# Patient Record
Sex: Male | Born: 1935 | Race: White | Hispanic: No | Marital: Married | Smoking: Never smoker
Health system: Southern US, Community
[De-identification: ages and names within clinical notes are randomized; demographics above are authoritative.]

## PROBLEM LIST (undated history)

## (undated) DIAGNOSIS — R001 Bradycardia, unspecified: Secondary | ICD-10-CM

## (undated) DIAGNOSIS — I951 Orthostatic hypotension: Secondary | ICD-10-CM

## (undated) DIAGNOSIS — I340 Nonrheumatic mitral (valve) insufficiency: Secondary | ICD-10-CM

## (undated) DIAGNOSIS — E785 Hyperlipidemia, unspecified: Secondary | ICD-10-CM

## (undated) DIAGNOSIS — R55 Syncope and collapse: Secondary | ICD-10-CM

## (undated) DIAGNOSIS — L570 Actinic keratosis: Secondary | ICD-10-CM

## (undated) DIAGNOSIS — E039 Hypothyroidism, unspecified: Secondary | ICD-10-CM

## (undated) HISTORY — PX: SHOULDER ARTHROSCOPY: SHX128

## (undated) HISTORY — DX: Actinic keratosis: L57.0

## (undated) HISTORY — DX: Orthostatic hypotension: I95.1

## (undated) HISTORY — DX: Syncope and collapse: R55

## (undated) HISTORY — DX: Hypothyroidism, unspecified: E03.9

## (undated) HISTORY — DX: Nonrheumatic mitral (valve) insufficiency: I34.0

## (undated) HISTORY — DX: Bradycardia, unspecified: R00.1

## (undated) HISTORY — PX: APPENDECTOMY: SHX54

## (undated) HISTORY — DX: Hyperlipidemia, unspecified: E78.5

---

## 2008-03-24 DIAGNOSIS — E7800 Pure hypercholesterolemia, unspecified: Secondary | ICD-10-CM | POA: Insufficient documentation

## 2009-09-29 DIAGNOSIS — E039 Hypothyroidism, unspecified: Secondary | ICD-10-CM | POA: Insufficient documentation

## 2016-06-06 DIAGNOSIS — H02831 Dermatochalasis of right upper eyelid: Secondary | ICD-10-CM | POA: Insufficient documentation

## 2016-06-06 DIAGNOSIS — H2513 Age-related nuclear cataract, bilateral: Secondary | ICD-10-CM | POA: Insufficient documentation

## 2016-06-06 DIAGNOSIS — H35373 Puckering of macula, bilateral: Secondary | ICD-10-CM | POA: Insufficient documentation

## 2016-06-06 DIAGNOSIS — H43813 Vitreous degeneration, bilateral: Secondary | ICD-10-CM | POA: Insufficient documentation

## 2016-06-06 DIAGNOSIS — H524 Presbyopia: Secondary | ICD-10-CM | POA: Insufficient documentation

## 2016-06-06 DIAGNOSIS — H52203 Unspecified astigmatism, bilateral: Secondary | ICD-10-CM | POA: Insufficient documentation

## 2016-07-11 DIAGNOSIS — M7710 Lateral epicondylitis, unspecified elbow: Secondary | ICD-10-CM | POA: Insufficient documentation

## 2016-07-14 DIAGNOSIS — J301 Allergic rhinitis due to pollen: Secondary | ICD-10-CM | POA: Insufficient documentation

## 2017-05-16 DIAGNOSIS — S8002XA Contusion of left knee, initial encounter: Secondary | ICD-10-CM | POA: Insufficient documentation

## 2017-05-16 DIAGNOSIS — S9030XA Contusion of unspecified foot, initial encounter: Secondary | ICD-10-CM | POA: Insufficient documentation

## 2017-08-21 DIAGNOSIS — M19039 Primary osteoarthritis, unspecified wrist: Secondary | ICD-10-CM | POA: Insufficient documentation

## 2018-04-16 DIAGNOSIS — M67431 Ganglion, right wrist: Secondary | ICD-10-CM | POA: Insufficient documentation

## 2018-09-22 ENCOUNTER — Emergency Department: Payer: Medicare Other

## 2018-09-22 ENCOUNTER — Other Ambulatory Visit: Payer: Self-pay

## 2018-09-22 ENCOUNTER — Observation Stay
Admission: EM | Admit: 2018-09-22 | Discharge: 2018-09-23 | Disposition: A | Discharge status: Home / Self Care | Payer: Medicare Other | Attending: Internal Medicine | Admitting: Internal Medicine

## 2018-09-22 DIAGNOSIS — E876 Hypokalemia: Secondary | ICD-10-CM | POA: Insufficient documentation

## 2018-09-22 DIAGNOSIS — R55 Syncope and collapse: Secondary | ICD-10-CM | POA: Diagnosis present

## 2018-09-22 DIAGNOSIS — E039 Hypothyroidism, unspecified: Secondary | ICD-10-CM | POA: Diagnosis not present

## 2018-09-22 DIAGNOSIS — R739 Hyperglycemia, unspecified: Secondary | ICD-10-CM | POA: Insufficient documentation

## 2018-09-22 DIAGNOSIS — N400 Enlarged prostate without lower urinary tract symptoms: Secondary | ICD-10-CM | POA: Insufficient documentation

## 2018-09-22 DIAGNOSIS — E871 Hypo-osmolality and hyponatremia: Secondary | ICD-10-CM | POA: Insufficient documentation

## 2018-09-22 DIAGNOSIS — D649 Anemia, unspecified: Secondary | ICD-10-CM | POA: Insufficient documentation

## 2018-09-22 DIAGNOSIS — Z7982 Long term (current) use of aspirin: Secondary | ICD-10-CM | POA: Insufficient documentation

## 2018-09-22 DIAGNOSIS — Z66 Do not resuscitate: Secondary | ICD-10-CM | POA: Insufficient documentation

## 2018-09-22 DIAGNOSIS — Z791 Long term (current) use of non-steroidal anti-inflammatories (NSAID): Secondary | ICD-10-CM | POA: Diagnosis not present

## 2018-09-22 DIAGNOSIS — Z79890 Hormone replacement therapy (postmenopausal): Secondary | ICD-10-CM | POA: Insufficient documentation

## 2018-09-22 DIAGNOSIS — Z79899 Other long term (current) drug therapy: Secondary | ICD-10-CM | POA: Diagnosis not present

## 2018-09-22 LAB — COMPREHENSIVE METABOLIC PANEL
ALK PHOS: 35 U/L — AB (ref 38–126)
ALT: 15 U/L (ref 0–44)
AST: 17 U/L (ref 15–41)
Albumin: 3.6 g/dL (ref 3.5–5.0)
Anion gap: 9 (ref 5–15)
BILIRUBIN TOTAL: 0.4 mg/dL (ref 0.3–1.2)
BUN: 10 mg/dL (ref 8–23)
CALCIUM: 8.4 mg/dL — AB (ref 8.9–10.3)
CO2: 25 mmol/L (ref 22–32)
CREATININE: 0.75 mg/dL (ref 0.61–1.24)
Chloride: 100 mmol/L (ref 98–111)
GFR calc non Af Amer: >60 mL/min (ref >60)
GLUCOSE: 121 mg/dL — AB (ref 70–99)
Potassium: 3.3 mmol/L — ABNORMAL LOW (ref 3.5–5.1)
SODIUM: 134 mmol/L — AB (ref 135–145)
TOTAL PROTEIN: 5.7 g/dL — AB (ref 6.5–8.1)

## 2018-09-22 LAB — CBC
HEMATOCRIT: 33.8 % — AB (ref 39.0–52.0)
HEMOGLOBIN: 11.7 g/dL — AB (ref 13.0–17.0)
MCH: 32 pg (ref 26.0–34.0)
MCHC: 34.6 g/dL (ref 30.0–36.0)
MCV: 92.3 fL (ref 80.0–100.0)
Platelets: 226 10*3/uL (ref 150–400)
RBC: 3.66 MIL/uL — AB (ref 4.22–5.81)
RDW: 13.1 % (ref 11.5–15.5)
WBC: 5.2 10*3/uL (ref 4.0–10.5)
nRBC: 0 % (ref 0.0–0.2)

## 2018-09-22 LAB — GLUCOSE, CAPILLARY: GLUCOSE-CAPILLARY: 120 mg/dL — AB (ref 70–99)

## 2018-09-22 LAB — TROPONIN I: Troponin I: <0.03 ng/mL (ref <0.03)

## 2018-09-22 LAB — PHOSPHORUS: Phosphorus: 3.7 mg/dL (ref 2.5–4.6)

## 2018-09-22 LAB — MAGNESIUM: Magnesium: 1.9 mg/dL (ref 1.7–2.4)

## 2018-09-22 MED ORDER — ACETAMINOPHEN 650 MG RE SUPP: 650.0000 mg | Freq: Four times a day (QID) | RECTAL | Status: DC | PRN

## 2018-09-22 MED ORDER — ONDANSETRON HCL 4 MG PO TABS: 4.0000 mg | ORAL_TABLET | Freq: Four times a day (QID) | ORAL | Status: DC | PRN

## 2018-09-22 MED ORDER — SENNOSIDES-DOCUSATE SODIUM 8.6-50 MG PO TABS: 1.0000 | ORAL_TABLET | Freq: Every evening | ORAL | Status: DC | PRN

## 2018-09-22 MED ORDER — SODIUM CHLORIDE 0.9% FLUSH
3.0000 mL | Freq: Two times a day (BID) | INTRAVENOUS | Status: DC
  Administered 2018-09-22 – 2018-09-23 (×2): 3 mL via INTRAVENOUS

## 2018-09-22 MED ORDER — ONDANSETRON HCL 4 MG/2ML IJ SOLN: 4.0000 mg | Freq: Four times a day (QID) | INTRAMUSCULAR | Status: DC | PRN

## 2018-09-22 MED ORDER — BISACODYL 5 MG PO TBEC: 5.0000 mg | DELAYED_RELEASE_TABLET | Freq: Every day | ORAL | Status: DC | PRN

## 2018-09-22 MED ORDER — ACETAMINOPHEN 325 MG PO TABS: 650.0000 mg | ORAL_TABLET | Freq: Four times a day (QID) | ORAL | Status: DC | PRN

## 2018-09-22 MED ORDER — ALUM & MAG HYDROXIDE-SIMETH 200-200-20 MG/5ML PO SUSP: 15.0000 mL | ORAL | Status: DC | PRN

## 2018-09-22 MED ORDER — ENOXAPARIN SODIUM 40 MG/0.4ML ~~LOC~~ SOLN
40.0000 mg | SUBCUTANEOUS | Status: DC
  Administered 2018-09-22: 40 mg via SUBCUTANEOUS
  Filled 2018-09-22: qty 0.4

## 2018-09-22 MED ORDER — POTASSIUM CHLORIDE CRYS ER 20 MEQ PO TBCR
40.0000 meq | EXTENDED_RELEASE_TABLET | Freq: Once | ORAL | Status: AC
  Administered 2018-09-22: 40 meq via ORAL
  Filled 2018-09-22: qty 2

## 2018-09-22 MED ORDER — SODIUM CHLORIDE 0.9 % IV SOLN
1000.0000 mL | Freq: Once | INTRAVENOUS | Status: AC
  Administered 2018-09-22: 1000 mL via INTRAVENOUS

## 2018-09-22 NOTE — Progress Notes (Addendum)
Pt refused his bed alarm at this time and states " will call the nurse once his ready". Pt was educated about safety. Will continue to monitor.  Update 0000: pt did agreed to turn the bed alarm on. Will continue to monitor.  Update 0612: Pt BP was at 187/77 HR 48. Notify prime. Will continue to monitor.  Update Dr. Duane Boston ordered IV hydralazine 10 mg IV every 6 hours PRN for SBP>170. Will continue to monitor.

## 2018-09-22 NOTE — H&P (Signed)
[facility] NAME: Brett Ray    MR#:  [id number]  DATE OF BIRTH:  02/01/1931  DATE OF ADMISSION:  09/22/2018  PRIMARY CARE PHYSICIAN: Derinda Late, MD   REQUESTING/REFERRING PHYSICIAN: Lavonia Drafts, MD  CHIEF COMPLAINT:  No chief complaint on file.  Syncope. HISTORY OF PRESENT ILLNESS:  Brett Ray  is a 84 y.o. male with no known past medical history who p/w syncope. He had shoulder surgery 7yrs ago, and tells me he has a "leaky valve" that was last evaluated by Echocardiography ~64yrs ago (performed in [place]), but he is an otherwise remarkably healthy individual. He tells me that he was at a bridal shower. He had several glasses of wine. He stated that he started to feel warm and lightheaded. He sat down for a short time. He states he tried to stand up, and blacked out. He did not hit his head or injure himself. He surmises he lowered himself to the ground. His wife was nearby, but did not notice until a short time later. He denies pain symptoms. He denies chest pain at present or preceding his episode. He states he was unconscious for < 1 min. He states the party host contacted EMS. When EMS attempted to sit pt up, he had recurrent syncope. He is sitting up in bed at the time of my assessment, and states he is feeling much improved. His HR is in the 50s, which is normal for him. His BP is slightly elevated. He is well-appearing and in no distress. He does not have any other complaints or issues.  I am told that pt may have taken Viagra. I am also told this should not be mentioned around the patient's family.  PAST MEDICAL HISTORY:  History reviewed. No pertinent past medical history.  As above. PAST SURGICAL HISTORY:   Past Surgical History:  Procedure Laterality Date  . APPENDECTOMY    . SHOULDER ARTHROSCOPY      SOCIAL HISTORY:   Social History   Tobacco Use  . Smoking status: Never Smoker  . Smokeless  tobacco: Never Used  Substance Use Topics  . Alcohol use: Yes    FAMILY HISTORY:  History reviewed. No pertinent family history.  DRUG ALLERGIES:  Not on File  REVIEW OF SYSTEMS:   Review of Systems  Constitutional: Negative for chills, diaphoresis, fever, malaise/fatigue and weight loss.  HENT: Negative for congestion, ear pain, hearing loss, nosebleeds, sinus pain, sore throat and tinnitus.   Eyes: Negative for blurred vision, double vision and photophobia.  Respiratory: Negative for cough, hemoptysis, sputum production, shortness of breath and wheezing.   Cardiovascular: Negative for chest pain, palpitations, orthopnea, claudication, leg swelling and PND.  Gastrointestinal: Negative for abdominal pain, blood in stool, constipation, diarrhea, heartburn, melena, nausea and vomiting.  Genitourinary: Negative for dysuria, flank pain, frequency, hematuria and urgency.  Musculoskeletal: Negative for back pain, falls, joint pain, myalgias and neck pain.  Skin: Negative for itching and rash.  Neurological: Positive for dizziness and loss of consciousness. Negative for tingling, tremors, sensory change, speech change, focal weakness, seizures, weakness and headaches.  Psychiatric/Behavioral: Negative for memory loss. The patient does not have insomnia.     MEDICATIONS AT HOME:   Prior to Admission medications   Not on File      VITAL SIGNS:  Blood pressure (!) 175/75, pulse (!) 51, temperature 97.6 F (36.4 C), temperature source Oral, resp. rate 17, height 5\' 10"  (1.778 m), weight 69.4  kg, SpO2 98 %.  PHYSICAL EXAMINATION:  Physical Exam  Constitutional: He is oriented to person, place, and time. He appears well-developed and well-nourished. He is active and cooperative.  Non-toxic appearance. He does not have a sickly appearance. He does not appear ill. No distress. He is not intubated.  HENT:  Head: Normocephalic and atraumatic.  Mouth/Throat: Oropharynx is clear and moist. No  oropharyngeal exudate.  Eyes: Conjunctivae, EOM and lids are normal. No scleral icterus.  Neck: Normal range of motion. Neck supple. No JVD present. No thyromegaly present.  Cardiovascular: Regular rhythm, S1 normal, S2 normal and normal heart sounds.  No extrasystoles are present. Bradycardia present. Exam reveals no gallop, no S3, no S4, no distant heart sounds and no friction rub.  No murmur heard. Pulmonary/Chest: Effort normal and breath sounds normal. No accessory muscle usage or stridor. No apnea, no tachypnea and no bradypnea. He is not intubated. No respiratory distress. He has no decreased breath sounds. He has no wheezes. He has no rhonchi. He has no rales. He exhibits no tenderness.  Abdominal: Soft. Bowel sounds are normal. He exhibits no distension. There is no tenderness. There is no rigidity, no rebound and no guarding.  Musculoskeletal: Normal range of motion. He exhibits no edema or tenderness.  Lymphadenopathy:    He has no cervical adenopathy.  Neurological: He is alert and oriented to person, place, and time. He is not disoriented.  Skin: Skin is warm, dry and intact. No rash noted. He is not diaphoretic. No erythema.  Psychiatric: He has a normal mood and affect. His speech is normal and behavior is normal. Judgment and thought content normal. Cognition and memory are normal.   LABORATORY PANEL:   CBC Recent Labs  Lab 09/22/18 2023  WBC 5.2  HGB 11.7*  HCT 33.8*  PLT 226   ------------------------------------------------------------------------------------------------------------------  Chemistries  Recent Labs  Lab 09/22/18 2023  NA 134*  K 3.3*  CL 100  CO2 25  GLUCOSE 121*  BUN 10  CREATININE 0.75  CALCIUM 8.4*  AST 17  ALT 15  ALKPHOS 35*  BILITOT 0.4   ------------------------------------------------------------------------------------------------------------------  Cardiac Enzymes Recent Labs  Lab 09/22/18 2023  TROPONINI <0.03    ------------------------------------------------------------------------------------------------------------------  RADIOLOGY:  Dg Chest Portable 1 View  Result Date: 09/22/2018 CLINICAL DATA:  Syncope EXAM: PORTABLE CHEST 1 VIEW COMPARISON:  None. FINDINGS: The heart size and mediastinal contours are within normal limits. Both lungs are clear. The visualized skeletal structures are unremarkable. IMPRESSION: No active disease. Electronically Signed   By: Ulyses Jarred M.D.   On: 09/22/2018 20:34   IMPRESSION AND PLAN:   A/P: 28M syncope. Mild hyponatremia, hypokalemia, hyperglycemia, hypocalcemia, normocytic anemia. -Syncope: LH/syncope, w/ recurrent positional syncope, improved. Drank wine. May or may not have taken a PDE-I. Narrative suspicious for orthostasis. Improved w/ IVF in ED. EKG (-) ischemic chg. Tele, continuous cardiac monitoring. Trop-I (-), rpt pending. Orthostatic VS, FSG qHS/AC, neuro checks q4h x24hr, fall precautions. Echo pending. IVF. -Hyponatremia: Hypovolemic hyponatremia. IVF as above. -Hypokalemia: Mild. Replete. Mag level WNL (1.9). -Hypocalcemia: Ionized calcium. -Normocytic anemia: Mild, Hgb 11.7. No evidence of acute blood loss. -No (chronic) home meds. -FEN/GI: Regular diet. -DVT PPx: Lovenox. -Code status: Full code. -Disposition: Observation, < 2 midnights.   All the records are reviewed and case discussed with ED provider. Management plans discussed with the patient, family and they are in agreement.  CODE STATUS: Full code.  TOTAL TIME TAKING CARE OF THIS PATIENT: 75 minutes.  Arta Silence M.D on 09/22/2018 at 9:34 PM  Between 7am to 6pm - Pager - [phone]  After 6pm go to [email or web address] - Proofreader  [facility]  Office  [phone]  CC: Primary care physician; Derinda Late, MD   Note: This dictation was prepared with Dragon dictation along with smaller phrase technology. Any  transcriptional errors that result from this process are unintentional.

## 2018-09-22 NOTE — ED Triage Notes (Signed)
Pt arrived from a christmas party via EMS with complaints of a syncope. Pt states that he did pass out but did not hit his head. Denies any pain at this time. Pt is alert and oriented x 4. Pt medication list is at the bedside, will point out that pt took Viagra today. Pt denies SOB or chest pain. VS per EMS BP-91/56 initially, Ems states that when they tried to sit him up to do orthostatics he passed out again. HR-40-58 BS-156 . EMS placed a 20 in left AC. EMS gave a NaCl bolus of 546ml. Wife is at bedside.

## 2018-09-22 NOTE — ED Provider Notes (Signed)
[facility] Emergency Department Provider Note   ____________________________________________    I have reviewed the triage vital signs and the nursing notes.   HISTORY  Chief Complaint Syncope    HPI Brett Ray is a 84 y.o. male who presents after a syncopal episode.  Patient reports that he was at a holiday party and abruptly syncopized.  He tried to sit up and apparently syncopized again.  When EMS got there they put him in a chair to try to carry him down some stairs and he syncopized again.  He denies chest pain palpitations or shortness of breath.  No fevers or chills.  No abdominal pain.  No history of arrhythmia.  No history of cardiac disease.  Currently feels weak and like if he were to sit up he might pass out again.  Does admit to taking Viagra and having 2 glasses of wine but reports he has done that many times before without difficulty  History reviewed. No pertinent past medical history.  Patient Active Problem List   Diagnosis Date Noted  . Syncope 09/22/2018    Past Surgical History:  Procedure Laterality Date  . APPENDECTOMY    . SHOULDER ARTHROSCOPY      Prior to Admission medications   Medication Sig Start Date End Date Taking? Authorizing Provider  aspirin EC 81 MG tablet Take 81 mg by mouth daily.   Yes [provider]  calcium carbonate (TUMS - DOSED IN MG ELEMENTAL CALCIUM) 500 MG chewable tablet Chew 1 tablet by mouth 2 (two) times daily as needed for indigestion or heartburn.   Yes [provider]  citalopram (CELEXA) 10 MG tablet Take 1 tablet by mouth daily. 08/28/18  Yes [provider]  fluticasone (FLONASE SENSIMIST) 27.5 MCG/SPRAY nasal spray Place 2 sprays into the nose daily.   Yes [provider]  levothyroxine (SYNTHROID, LEVOTHROID) 112 MCG tablet Take 112 mcg by mouth daily before breakfast.   Yes [provider]  meloxicam (MOBIC) 15 MG tablet Take 1 tablet by  mouth daily. 07/18/18  Yes [provider]  polyethylene glycol (MIRALAX / GLYCOLAX) packet Take 17 g by mouth daily.   Yes [provider]  sildenafil (REVATIO) 20 MG tablet TK 3-5 TS PO PRN PRIOR TO SEX 07/25/18  Yes [provider]  tamsulosin (FLOMAX) 0.4 MG CAPS capsule Take 1 capsule by mouth daily. 07/04/18  Yes [provider]  vitamin C (ASCORBIC ACID) 500 MG tablet Take 500 mg by mouth daily.   Yes [provider]     Allergies Patient has no allergy information on record.  History reviewed. No pertinent family history.  Social History Social History   Tobacco Use  . Smoking status: Never Smoker  . Smokeless tobacco: Never Used  Substance Use Topics  . Alcohol use: Yes  . Drug use: Never    Review of Systems  Constitutional: No fever/chills Eyes: No visual changes.  ENT: No sore throat. Cardiovascular: Denies chest pain. Respiratory: Denies shortness of breath. Gastrointestinal: No abdominal pain.  Genitourinary: Negative for dysuria. Musculoskeletal: Negative for back pain. Skin: Negative for rash. Neurological: Negative for headaches    ____________________________________________   PHYSICAL EXAM:  VITAL SIGNS: ED Triage Vitals  Enc Vitals Group     BP 09/22/18 2009 (!) 169/71     Pulse Rate 09/22/18 2009 (!) 48     Resp 09/22/18 2009 12     Temp 09/22/18 2009 97.6 F (36.4 C)  Temp Source 09/22/18 2009 Oral     SpO2 09/22/18 2009 93 %     Weight 09/22/18 2010 69.4 kg (153 lb)     Height 09/22/18 2010 1.778 m (5\' 10" )     Head Circumference --      Peak Flow --      Pain Score 09/22/18 2010 0     Pain Loc --      Pain Edu? --      Excl. in [place]? --     Constitutional: Alert and oriented. No acute distress. Pleasant and interactive Eyes: Conjunctivae are normal.   Nose: No congestion/rhinnorhea. Mouth/Throat: Mucous membranes are moist.    Cardiovascular: Normal rate, regular rhythm.  Systolic  murmur, good peripheral circulation. Respiratory: Normal respiratory effort.  No retractions. Lungs CTAB. Gastrointestinal: Soft and nontender. No distention.    Musculoskeletal:  Warm and well perfused Neurologic:  Normal speech and language. No gross focal neurologic deficits are appreciated.  Skin:  Skin is warm, dry and intact. No rash noted. Psychiatric: Mood and affect are normal. Speech and behavior are normal.  ____________________________________________   LABS (all labs ordered are listed, but only abnormal results are displayed)  Labs Reviewed  CBC - Abnormal; Notable for the following components:      Result Value   RBC 3.66 (*)    Hemoglobin 11.7 (*)    HCT 33.8 (*)    All other components within normal limits  COMPREHENSIVE METABOLIC PANEL - Abnormal; Notable for the following components:   Sodium 134 (*)    Potassium 3.3 (*)    Glucose, Bld 121 (*)    Calcium 8.4 (*)    Total Protein 5.7 (*)    Alkaline Phosphatase 35 (*)    All other components within normal limits  TROPONIN I  MAGNESIUM  PHOSPHORUS  CALCIUM, IONIZED  PREALBUMIN  TROPONIN I  TROPONIN I   ____________________________________________  EKG  ED ECG REPORT I, Lavonia Drafts, the attending physician, personally viewed and interpreted this ECG.  Date: 09/22/2018  Rhythm: Sinus bradycardia QRS Axis: normal Intervals: normal ST/T Wave abnormalities: normal Narrative Interpretation: no evidence of acute ischemia  ____________________________________________  RADIOLOGY  Chest x-ray unremarkable ____________________________________________   PROCEDURES  Procedure(s) performed: No  Procedures   Critical Care performed:No ____________________________________________   INITIAL IMPRESSION / ASSESSMENT AND PLAN / ED COURSE  Pertinent labs & imaging results that were available during my care of the patient were reviewed by me and considered in my medical decision making (see chart  for details).  Patient presents after multiple syncopal episodes.  Seem to be postural in nature.  Possibly related to Viagra and alcohol intake but he does state that this is happened many times  No description of palpitations to suggest arrhythmia but this is certainly a possibility.  Doubt ACS as no chest pain.  No recent travel, no pleurisy to suggest PE.  Patient reports his resting heart rate is in the 40s, this is consistent with his EKG.  Lab work is overall unremarkable.  Patient reports he feels somewhat better after fluids.  Will admit to the hospital service for further management    ____________________________________________   FINAL CLINICAL IMPRESSION(S) / ED DIAGNOSES Syncope     Note:  This document was prepared using Dragon voice recognition software and may include unintentional dictation errors.    Lavonia Drafts, MD 09/22/18 2235

## 2018-09-23 ENCOUNTER — Observation Stay (HOSPITAL_BASED_OUTPATIENT_CLINIC_OR_DEPARTMENT_OTHER)
Admit: 2018-09-23 | Discharge: 2018-09-23 | Disposition: A | Discharge status: Home / Self Care | Payer: Medicare Other | Attending: Internal Medicine | Admitting: Internal Medicine

## 2018-09-23 DIAGNOSIS — I340 Nonrheumatic mitral (valve) insufficiency: Secondary | ICD-10-CM

## 2018-09-23 DIAGNOSIS — I351 Nonrheumatic aortic (valve) insufficiency: Secondary | ICD-10-CM

## 2018-09-23 LAB — TROPONIN I: Troponin I: <0.03 ng/mL (ref <0.03)

## 2018-09-23 LAB — GLUCOSE, CAPILLARY
Glucose-Capillary: 104 mg/dL — ABNORMAL HIGH (ref 70–99)
Glucose-Capillary: 106 mg/dL — ABNORMAL HIGH (ref 70–99)

## 2018-09-23 LAB — PREALBUMIN: PREALBUMIN: 24.2 mg/dL (ref 18–38)

## 2018-09-23 LAB — CORTISOL-AM, BLOOD: CORTISOL - AM: 18.3 ug/dL (ref 6.7–22.6)

## 2018-09-23 MED ORDER — VITAMIN C 500 MG PO TABS
500.0000 mg | ORAL_TABLET | Freq: Every day | ORAL | Status: DC
  Administered 2018-09-23: 500 mg via ORAL
  Filled 2018-09-23: qty 1

## 2018-09-23 MED ORDER — FLUTICASONE PROPIONATE 50 MCG/ACT NA SUSP
2.0000 | Freq: Every day | NASAL | Status: DC
  Filled 2018-09-23: qty 16

## 2018-09-23 MED ORDER — POLYETHYLENE GLYCOL 3350 17 G PO PACK
17.0000 g | PACK | Freq: Every day | ORAL | Status: DC
  Filled 2018-09-23: qty 1

## 2018-09-23 MED ORDER — FLUTICASONE FUROATE 27.5 MCG/SPRAY NA SUSP: 2.0000 | Freq: Every day | NASAL | Status: DC

## 2018-09-23 MED ORDER — HYDRALAZINE HCL 20 MG/ML IJ SOLN
10.0000 mg | Freq: Four times a day (QID) | INTRAMUSCULAR | Status: DC | PRN
  Administered 2018-09-23: 10 mg via INTRAVENOUS
  Filled 2018-09-23: qty 1

## 2018-09-23 MED ORDER — SODIUM CHLORIDE 0.9 % IV BOLUS
500.0000 mL | Freq: Once | INTRAVENOUS | Status: AC
  Administered 2018-09-23: 500 mL via INTRAVENOUS

## 2018-09-23 MED ORDER — SODIUM CHLORIDE 0.9 % IV SOLN: INTRAVENOUS | Status: DC

## 2018-09-23 MED ORDER — CITALOPRAM HYDROBROMIDE 20 MG PO TABS
10.0000 mg | ORAL_TABLET | Freq: Every day | ORAL | Status: DC
  Administered 2018-09-23: 10 mg via ORAL
  Filled 2018-09-23: qty 1

## 2018-09-23 MED ORDER — FINASTERIDE 5 MG PO TABS: 5.0000 mg | ORAL_TABLET | Freq: Every day | ORAL | 0 refills | Status: AC

## 2018-09-23 MED ORDER — LEVOTHYROXINE SODIUM 112 MCG PO TABS
112.0000 ug | ORAL_TABLET | Freq: Every day | ORAL | Status: DC
  Administered 2018-09-23: 112 ug via ORAL
  Filled 2018-09-23: qty 1

## 2018-09-23 MED ORDER — LEVOTHYROXINE SODIUM 112 MCG PO TABS
112.0000 ug | ORAL_TABLET | Freq: Every day | ORAL | Status: DC
  Filled 2018-09-23: qty 1

## 2018-09-23 MED ORDER — ASPIRIN EC 81 MG PO TBEC
81.0000 mg | DELAYED_RELEASE_TABLET | Freq: Every day | ORAL | Status: DC
  Administered 2018-09-23: 81 mg via ORAL
  Filled 2018-09-23: qty 1

## 2018-09-23 NOTE — Discharge Summary (Signed)
[facility] at [place] NAME: Brett Ray    MR#:  [id number]  DATE OF BIRTH:  [birth year]  DATE OF ADMISSION:  09/22/2018 ADMITTING PHYSICIAN: Arta Silence, MD  DATE OF DISCHARGE: 09/23/2018 12:54 PM  PRIMARY CARE PHYSICIAN: Derinda Late, MD    ADMISSION DIAGNOSIS:  syncope  DISCHARGE DIAGNOSIS:  Active Problems:   Syncope   SECONDARY DIAGNOSIS:  History reviewed. No pertinent past medical history.  HOSPITAL COURSE:   1.  Syncope.  This is likely vasovagal in nature.  The patient states that he ate lunch and then went out to a party he had a few alcoholic beverages felt dizzy in nature sat down but then they wanted to move into a bedroom and then he passed out.  When EMS tried to sit him up to get him into the ambulance he passed out again.  He was orthostatic last night and again this morning but less so.  I gave him another fluid bolus.  I held his Flomax.  I advised him to stay hydrated and drink less alcohol.  Advised him to hold off on the Viagra for now.  The patient wanted to leave the hospital as soon as possible.  Echocardiogram still pending but done.  No arrhythmias on telemetry.  Recommend checking orthostatic vital signs at follow-up appointment. 2.  BPH.  Switch Flomax over the Proscar 3.  Depression on Celexa 4.  Hypothyroidism unspecified on levothyroxine 5.  Hypokalemia replaced during the hospital course  DISCHARGE CONDITIONS:   Satisfactory  CONSULTS OBTAINED:  Treatment Team:  Arta Silence, MD  DRUG ALLERGIES:  Not on File  DISCHARGE MEDICATIONS:   Allergies as of 09/23/2018   Not on File     Medication List    STOP taking these medications   sildenafil 20 MG tablet Commonly known as:  REVATIO   tamsulosin 0.4 MG Caps capsule Commonly known as:  FLOMAX     TAKE these medications   aspirin EC 81 MG tablet Take 81 mg by mouth daily.   calcium carbonate 500 MG chewable  tablet Commonly known as:  TUMS - dosed in mg elemental calcium Chew 1 tablet by mouth 2 (two) times daily as needed for indigestion or heartburn.   citalopram 10 MG tablet Commonly known as:  CELEXA Take 1 tablet by mouth daily.   finasteride 5 MG tablet Commonly known as:  PROSCAR Take 1 tablet (5 mg total) by mouth daily.   FLONASE SENSIMIST 27.5 MCG/SPRAY nasal spray Generic drug:  fluticasone Place 2 sprays into the nose daily.   levothyroxine 112 MCG tablet Commonly known as:  SYNTHROID, LEVOTHROID Take 112 mcg by mouth daily before breakfast.   meloxicam 15 MG tablet Commonly known as:  MOBIC Take 1 tablet by mouth daily.   polyethylene glycol packet Commonly known as:  MIRALAX / GLYCOLAX Take 17 g by mouth daily.   vitamin C 500 MG tablet Commonly known as:  ASCORBIC ACID Take 500 mg by mouth daily.        DISCHARGE INSTRUCTIONS:   Follow-up PMD 5 days Follow-up echocardiogram  If you experience worsening of your admission symptoms, develop shortness of breath, life threatening emergency, suicidal or homicidal thoughts you must seek medical attention immediately by calling 911 or calling your MD immediately  if symptoms less severe.  You Must read complete instructions/literature along with all the possible adverse reactions/side effects for all the Medicines you take and that have been prescribed to  you. Take any new Medicines after you have completely understood and accept all the possible adverse reactions/side effects.   Please note  You were cared for by a hospitalist during your hospital stay. If you have any questions about your discharge medications or the care you received while you were in the hospital after you are discharged, you can call the unit and asked to speak with the hospitalist on call if the hospitalist that took care of you is not available. Once you are discharged, your primary care physician will handle any further medical issues. Please  note that NO REFILLS for any discharge medications will be authorized once you are discharged, as it is imperative that you return to your primary care physician (or establish a relationship with a primary care physician if you do not have one) for your aftercare needs so that they can reassess your need for medications and monitor your lab values.    Today   CHIEF COMPLAINT:  Syncope  HISTORY OF PRESENT ILLNESS:  Brett Ray  is a 84 y.o. male presented after syncopal event   VITAL SIGNS:  Blood pressure (!) 142/67, pulse 76, temperature 98 F (36.7 C), temperature source Oral, resp. rate 17, height 5\' 10"  (1.778 m), weight 70.4 kg, SpO2 97 %.   PHYSICAL EXAMINATION:  GENERAL:  84 y.o.-year-old patient lying in the bed with no acute distress.  EYES: Pupils equal, round, reactive to light and accommodation. No scleral icterus. Extraocular muscles intact.  HEENT: Head atraumatic, normocephalic. Oropharynx and nasopharynx clear.  Tympanic membrane no erythema or bulging. NECK:  Supple, no jugular venous distention. No thyroid enlargement, no tenderness.  LUNGS: Normal breath sounds bilaterally, no wheezing, rales,rhonchi or crepitation. No use of accessory muscles of respiration.  CARDIOVASCULAR: S1, S2 normal. No murmurs, rubs, or gallops.  ABDOMEN: Soft, non-tender, non-distended. Bowel sounds present. No organomegaly or mass.  EXTREMITIES: No pedal edema, cyanosis, or clubbing.  NEUROLOGIC: Cranial nerves II through XII are intact. Muscle strength 5/5 in all extremities. Sensation intact. Gait not checked.  PSYCHIATRIC: The patient is alert and oriented x 3.  SKIN: No obvious rash, lesion, or ulcer.   DATA REVIEW:   CBC Recent Labs  Lab 09/22/18 2023  WBC 5.2  HGB 11.7*  HCT 33.8*  PLT 226    Chemistries  Recent Labs  Lab 09/22/18 2023  NA 134*  K 3.3*  CL 100  CO2 25  GLUCOSE 121*  BUN 10  CREATININE 0.75  CALCIUM 8.4*  MG 1.9  AST 17  ALT 15  ALKPHOS 35*   BILITOT 0.4    Cardiac Enzymes Recent Labs  Lab 09/23/18 0525  TROPONINI <0.03     RADIOLOGY:  Dg Chest Portable 1 View  Result Date: 09/22/2018 CLINICAL DATA:  Syncope EXAM: PORTABLE CHEST 1 VIEW COMPARISON:  None. FINDINGS: The heart size and mediastinal contours are within normal limits. Both lungs are clear. The visualized skeletal structures are unremarkable. IMPRESSION: No active disease. Electronically Signed   By: Ulyses Jarred M.D.   On: 09/22/2018 20:34      Management plans discussed with the patient, family and they are in agreement.  CODE STATUS:     Code Status Orders  (From admission, onward)         Start     Ordered   09/22/18 2224  Full code  Continuous     09/22/18 2223        Code Status History    This patient has  a current code status but no historical code status.    Advance Directive Documentation     Most Recent Value  Type of Advance Directive  Healthcare Power of Attorney, Living will  Pre-existing out of facility DNR order (yellow form or pink MOST form)  -  "MOST" Form in Place?  -      TOTAL TIME TAKING CARE OF THIS PATIENT: 35 minutes.    Loletha Grayer M.D on 09/23/2018 at 3:09 PM  Between 7am to 6pm - Pager - [phone]  After 6pm go to [email or web address] - password [email or web address] [facility]  [phone]  CC: Primary care physician; Derinda Late, MD

## 2018-09-23 NOTE — Plan of Care (Signed)
  Problem: Health Behavior/Discharge Planning: Goal: Ability to manage health-related needs will improve Outcome: Progressing   Problem: Safety: Goal: Ability to remain free from injury will improve Outcome: Progressing   

## 2018-09-23 NOTE — Progress Notes (Signed)
Pt discharged via wheelchair. Discharge paperwork reviewed with patient.  No questions or concerns at this time.

## 2018-09-23 NOTE — Plan of Care (Signed)

## 2018-09-24 LAB — ECHOCARDIOGRAM COMPLETE
Height: 70 in
Weight: 2484.8 oz

## 2018-09-24 LAB — CALCIUM, IONIZED

## 2018-09-30 DIAGNOSIS — F32A Depression, unspecified: Secondary | ICD-10-CM | POA: Insufficient documentation

## 2018-09-30 DIAGNOSIS — E039 Hypothyroidism, unspecified: Secondary | ICD-10-CM | POA: Insufficient documentation

## 2018-09-30 DIAGNOSIS — F329 Major depressive disorder, single episode, unspecified: Secondary | ICD-10-CM | POA: Insufficient documentation

## 2018-09-30 NOTE — Progress Notes (Signed)
Cardiology Office Note  Date:  10/01/2018   ID:  [name], DOB 1931/01/15, MRN [id number]  PCP:  Derinda Late, MD   Chief Complaint  Patient presents with  . other    Syncope. Meds reviewed verbally with pt.    HPI:  Mr. Brett Ray is a 84 year old gentleman with past medical history of Hypokalemia Depression Hypothyroid Self referred for syncope  For 2 to 3 years, rare orthostasis Active exercise bike The past year, "balance issue"  Seen in the hospital November 2019 for syncope Felt flush after drinking several glasses of wine on empty stomach With sitting, tried to get up to go to the bathroom had acute syncope EMS called BP-91/56 initially,   Sat him up in a chair had syncope again He reported having Viagra 40 mg prior to the party, was also on Flomax Bradycardia rate 48 Reports having chronic bradycardia, typically is asymptomatic  In the hospital  No arrhythmias on telemetry.   Echocardiogram Left ventricle: The cavity size was mildly dilated. Wall   thickness was normal. Systolic function was normal. The estimated   ejection fraction was in the range of 55% to 60%. Wall motion was   normal; there were no regional wall motion abnormalities. Left   ventricular diastolic function parameters were normal. - Aortic valve: There was mild regurgitation. - Mitral valve: There was moderate regurgitation. - Right ventricle: The cavity size was mildly dilated. Wall   thickness was normal. - Right atrium: The atrium was mildly dilated. - Pulmonary arteries: Systolic pressure was mildly to moderately   increased. PA peak pressure: 45 mm Hg (S).  Currently feels well with no complaints Blood pressure elevated on today's visit Does not check his blood pressure at home Exercise on a regular basis without symptoms of chest pain or shortness of breath  EKG personally reviewed by myself on todays visit Shows sinus bradycardia rate 46 bpm right bundle branch block  LVH no significant ST or T wave changes  Ambulated around the office monitoring heart rate which increased from 48 bpm at rest up to 70 bpm, 1 lap around the office  Orthostatics on today's visit 182/89 heart rate 46 supine 193/92 heart rate 48 sitting 163/79 heart rate 51 standing 156/85 heart rate 51 standing 3 minutes   PMH:   has a past medical history of Hyperlipidemia, Leaky heart valve, and Thyroid disease.  Mitral valve regurgitation Bradycardia  PSH:    Past Surgical History:  Procedure Laterality Date  . APPENDECTOMY    . SHOULDER ARTHROSCOPY      Current Outpatient Medications  Medication Sig Dispense Refill  . aspirin EC 81 MG tablet Take 81 mg by mouth daily.    . bimatoprost (LUMIGAN) 0.03 % ophthalmic solution Place 1 drop into both eyes at bedtime.    . calcium carbonate (TUMS - DOSED IN MG ELEMENTAL CALCIUM) 500 MG chewable tablet Chew 1 tablet by mouth 2 (two) times daily as needed for indigestion or heartburn.    . citalopram (CELEXA) 10 MG tablet Take 1 tablet by mouth daily.  3  . finasteride (PROSCAR) 5 MG tablet Take 1 tablet (5 mg total) by mouth daily. 30 tablet 0  . fluticasone (FLONASE SENSIMIST) 27.5 MCG/SPRAY nasal spray Place 2 sprays into the nose daily.    Marland Kitchen gentamicin ointment (GARAMYCIN) 0.1 % 3 (three) times daily.  3  . levothyroxine (SYNTHROID, LEVOTHROID) 125 MCG tablet Take 125 mcg by mouth daily before breakfast.    . meloxicam (  MOBIC) 15 MG tablet Take 1 tablet by mouth daily.  1  . polyethylene glycol (MIRALAX / GLYCOLAX) packet Take 17 g by mouth daily.    . vitamin C (ASCORBIC ACID) 500 MG tablet Take 500 mg by mouth daily.     No current facility-administered medications for this visit.      Allergies:   Patient has no known allergies.   Social History:  The patient  reports that he has never smoked. He has never used smokeless tobacco. He reports that he drinks about 2.0 standard drinks of alcohol per week. He reports that he  does not use drugs.   Family History:   family history is not on file.    Review of Systems: Review of Systems  Constitutional: Negative.   Respiratory: Negative.   Cardiovascular: Negative.   Gastrointestinal: Negative.   Musculoskeletal: Negative.   Neurological: Positive for loss of consciousness.  Psychiatric/Behavioral: Negative.   All other systems reviewed and are negative.    PHYSICAL EXAM: VS:  BP (!) 175/83 (BP Location: Right Arm, Patient Position: Sitting, Cuff Size: Normal)   Pulse (!) 46   Ht 5\' 10"  (1.778 m)   Wt 161 lb 8 oz (73.3 kg)   BMI 23.17 kg/m  , BMI Body mass index is 23.17 kg/m. GEN: Well nourished, well developed, in no acute distress  HEENT: normal  Neck: no JVD, carotid bruits, or masses Cardiac: Regular rhythm, bradycardic no murmurs, rubs, or gallops,no edema  Respiratory:  clear to auscultation bilaterally, normal work of breathing GI: soft, nontender, nondistended, + BS MS: no deformity or atrophy  Skin: warm and dry, no rash Neuro:  Strength and sensation are intact Psych: euthymic mood, full affect   Recent Labs: 09/22/2018: ALT 15; BUN 10; Creatinine, Ser 0.75; Hemoglobin 11.7; Magnesium 1.9; Platelets 226; Potassium 3.3; Sodium 134    Lipid Panel No results found for: CHOL, HDL, LDLCALC, TRIG    Wt Readings from Last 3 Encounters:  10/01/18 161 lb 8 oz (73.3 kg)  09/23/18 155 lb 4.8 oz (70.4 kg)       ASSESSMENT AND PLAN:  Syncope, unspecified syncope type - Plan: EKG 12-Lead, VAS US CAROTID Evening of his party had Viagra 40 mg, Flomax, followed by several glasses of wine,  Chronic bradycardia Likely perfect storm leading to orthostasis Recovery in the ER with no arrhythmia, echocardiogram unchanged from 2011 with normal ejection fraction moderate MR -We have ordered carotid ultrasound, low likelihood of atherosclerotic disease -He is mildly orthostatic on today's visit dropping 295 systolic down to 188 with standing, no  significant recovery after 3 minutes -We did discuss long-term monitor if he has any further episodes of syncope or near syncope  Orthostasis - Plan: EKG 12-Lead, VAS US CAROTID We have recommended he stay hydrated We discussed drop in blood pressure numbers today He is no longer on Viagra or Flomax still with 40 point drop in numbers with standing Suggested he call our office with blood pressure numbers over the next several weeks Suggest we not be very aggressive with medications  Bradycardia Asymptomatic, he does have good chronotropic competence heart rate 48 up to 70 walking around the office Asymptomatic at baseline   Depression, unspecified depression type Managed by primary care Recommend he try to avoid excessive alcohol on empty stomach  Hypothyroidism, unspecified type Managed by primary care  Disposition:   F/U as needed   Orders Placed This Encounter  Procedures  . EKG 12-Lead  Signed, Esmond Plants, M.D., Ph.D. 10/01/2018  [place], [place]

## 2018-10-01 ENCOUNTER — Ambulatory Visit: Payer: Medicare Other | Admitting: Cardiovascular Disease

## 2018-10-01 ENCOUNTER — Encounter: Payer: Self-pay | Admitting: Cardiovascular Disease

## 2018-10-01 VITALS — BP 175/83 | HR 46 | Ht 70.0 in | Wt 161.5 lb

## 2018-10-01 DIAGNOSIS — I951 Orthostatic hypotension: Secondary | ICD-10-CM

## 2018-10-01 DIAGNOSIS — R001 Bradycardia, unspecified: Secondary | ICD-10-CM

## 2018-10-01 DIAGNOSIS — F329 Major depressive disorder, single episode, unspecified: Secondary | ICD-10-CM

## 2018-10-01 DIAGNOSIS — F32A Depression, unspecified: Secondary | ICD-10-CM

## 2018-10-01 DIAGNOSIS — R55 Syncope and collapse: Secondary | ICD-10-CM

## 2018-10-01 DIAGNOSIS — E039 Hypothyroidism, unspecified: Secondary | ICD-10-CM | POA: Diagnosis not present

## 2018-10-01 NOTE — Patient Instructions (Signed)
Please monitor blood pressure at home Sitting and standing   Medication Instructions:  No changes  If you need a refill on your cardiac medications before your next appointment, please call your pharmacy.    Lab work: No new labs needed   If you have labs (blood work) drawn today and your tests are completely normal, you will receive your results only by: Marland Kitchen MyChart Message (if you have MyChart) OR . A paper copy in the mail If you have any lab test that is abnormal or we need to change your treatment, we will call you to review the results.   Testing/Procedures: Carotid u/s for syncope  Follow-Up: At [facility], you and your health needs are our priority.  As part of our continuing mission to provide you with exceptional heart care, we have created designated Provider Care Teams.  These Care Teams include your primary Cardiologist (physician) and Advanced Practice Providers (APPs -  Physician Assistants and Nurse Practitioners) who all work together to provide you with the care you need, when you need it.  . You will need a follow up appointment as needed .   Please call our office 2 months in advance to schedule this appointment.    . Providers on your designated Care Team:   . Murray Hodgkins, NP . Christell Faith, PA-C . Marrianne Mood, PA-C  Any Other Special Instructions Will Be Listed Below (If Applicable).  For educational health videos Log in to : [email or web address] Or : [email or web address], password : [email or web address]

## 2018-10-02 ENCOUNTER — Ambulatory Visit: Payer: Medicare Other | Admitting: Cardiovascular Disease

## 2018-10-02 ENCOUNTER — Encounter

## 2018-10-03 ENCOUNTER — Ambulatory Visit (INDEPENDENT_AMBULATORY_CARE_PROVIDER_SITE_OTHER): Payer: Medicare Other

## 2018-10-03 DIAGNOSIS — R55 Syncope and collapse: Secondary | ICD-10-CM | POA: Diagnosis not present

## 2018-10-03 DIAGNOSIS — I951 Orthostatic hypotension: Secondary | ICD-10-CM | POA: Diagnosis not present

## 2018-11-02 ENCOUNTER — Ambulatory Visit: Payer: Medicare Other | Admitting: Nurse Practitioner

## 2018-11-02 ENCOUNTER — Encounter: Payer: Self-pay | Admitting: Nurse Practitioner

## 2018-11-02 VITALS — BP 140/70 | HR 49 | Ht 70.0 in | Wt 163.0 lb

## 2018-11-02 DIAGNOSIS — R001 Bradycardia, unspecified: Secondary | ICD-10-CM

## 2018-11-02 DIAGNOSIS — R55 Syncope and collapse: Secondary | ICD-10-CM

## 2018-11-02 NOTE — Progress Notes (Signed)
Office Visit    Patient Name: Brett Ray Date of Encounter: 11/02/2018  Primary Care Provider:  Derinda Late, MD Primary Cardiologist:  Ida Rogue, MD  Chief Complaint    84 year old male with a history of syncope, sinus bradycardia, hyperlipidemia, hypothyroidism, and orthostasis, who presents for follow-up related to syncope.  Past Medical History    Past Medical History:  Diagnosis Date  . Asymptomatic Sinus bradycardia   . Hyperlipidemia   . Hypothyroidism   . Moderate mitral regurgitation    a. 09/2018 Echo: Mod MR.  . Orthostasis   . Syncope    a. 09/2018 syncope in the setting of wine/empty stomach/sildenafil/orthostasis; b. 09/2018 Echo: EF 55-60%, no rwma, mild AI, mod MR, mildly dil RV/RA. PASP 61mmHg; c. 09/2018 Carotid U/S: <39% bilat ICA dzs.   Past Surgical History:  Procedure Laterality Date  . APPENDECTOMY    . SHOULDER ARTHROSCOPY      Allergies  No Known Allergies  History of Present Illness   84 year old male with the above past medical history including hypothyroidism, sinus bradycardia, and orthostasis.  He was seen in the emergency department in November 2019 in the setting of syncope after drinking several glasses of wine on empty stomach and having also used Viagra earlier that day.  In the ER he was bradycardic at a rate of 48, which is longstanding for him.  Blood pressure was 91/56 in the ED.  No arrhythmias or remarkable lab findings.  He followed up with Dr. Rockey Situ and subsequently underwent echocardiogram which showed normal LV function.  Moderate mitral vegetation was noted as well as mild AI.  Carotid ultrasound showed no significant stenosis bilaterally.  Since his last visit, he has remained active, exercising most days of the week.  He has had no recurrence of presyncope or syncope.  He does sometimes note lightheadedness with standing but he is accustomed to this and just stands more slowly.  He denies chest pain, palpitations,  dyspnea, PND, orthopnea, edema, or early satiety.  Home Medications    Prior to Admission medications   Medication Sig Start Date End Date Taking? Authorizing Provider  aspirin EC 81 MG tablet Take 81 mg by mouth daily.   Yes [provider]  bimatoprost (LUMIGAN) 0.03 % ophthalmic solution Place 1 drop into both eyes at bedtime.   Yes [provider]  calcium carbonate (TUMS - DOSED IN MG ELEMENTAL CALCIUM) 500 MG chewable tablet Chew 1 tablet by mouth 2 (two) times daily as needed for indigestion or heartburn.   Yes [provider]  citalopram (CELEXA) 10 MG tablet Take 1 tablet by mouth daily. 08/28/18  Yes [provider]  finasteride (PROSCAR) 5 MG tablet Take 1 tablet (5 mg total) by mouth daily. 09/23/18 09/23/19 Yes Wieting, Richard, MD  fluticasone (FLONASE SENSIMIST) 27.5 MCG/SPRAY nasal spray Place 2 sprays into the nose daily.   Yes [provider]  levothyroxine (SYNTHROID, LEVOTHROID) 125 MCG tablet Take 125 mcg by mouth daily before breakfast.   Yes [provider]  meloxicam (MOBIC) 15 MG tablet Take 1 tablet by mouth daily. 07/18/18  Yes [provider]  polyethylene glycol (MIRALAX / GLYCOLAX) packet Take 17 g by mouth daily.   Yes [provider]  vitamin C (ASCORBIC ACID) 500 MG tablet Take 500 mg by mouth daily.   Yes [provider]    Review of Systems    Doing well.  He denies chest pain, palpitations, dyspnea, pnd, orthopnea, n, v,  dizziness, syncope, edema, weight gain, or early satiety.  All other systems reviewed and are otherwise negative except as noted above.  Physical Exam    VS:  BP 140/70 (BP Location: Left Arm, Patient Position: Sitting, Cuff Size: Normal)   Pulse (!) 49   Ht 5\' 10"  (1.778 m)   Wt 163 lb (73.9 kg)   BMI 23.39 kg/m  , BMI Body mass index is 23.39 kg/m. GEN: Well nourished, well developed, in no acute distress. HEENT: normal. Neck: Supple, no JVD, carotid  bruits, or masses. Cardiac: RRR, brady, 1/6 syst murmur @ apex, no rubs, or gallops. No clubbing, cyanosis, edema.  Radials/DP/PT 2+ and equal bilaterally.  Respiratory:  Respirations regular and unlabored, clear to auscultation bilaterally. GI: Soft, nontender, nondistended, BS + x 4. MS: no deformity or atrophy. Skin: warm and dry, no rash. Neuro:  Strength and sensation are intact. Psych: Normal affect.  Accessory Clinical Findings    ECG personally reviewed by me today - sinus bradycardia, rbbb - no acute changes.  Assessment & Plan    1.  Syncope: Patient suffered a syncopal episode in November in the setting of Viagra usage, multiple glasses of wine, and empty stomach.  He also taken Flomax that day.  Pressure was soft in the ER.  No other acute findings.  Follow-up echocardiogram shows normal LV function with moderate mitral regurgitation and mild AI.  Carotid ultrasound showed no significant disease bilaterally.  He has chronic mild orthostasis which she manages by getting up more slowly.  He has had no recurrence of syncope.  No further work-up at this time.  2.  Sinus bradycardia: This is chronic and he has good chronotropic competence.  Exercises regularly without intolerance.  Continue to avoid AV nodal blocking agents but in the absence of high-grade heart block, no further evaluation required at this point.  3.  Disposition: Follow-up with Dr. Rockey Situ in 6 months or sooner if necessary.   Murray Hodgkins, NP 11/02/2018, 6:19 PM

## 2018-11-02 NOTE — Patient Instructions (Signed)
Medication Instructions:  Your physician recommends that you continue on your current medications as directed. Please refer to the Current Medication list given to you today.  If you need a refill on your cardiac medications before your next appointment, please call your pharmacy.   Lab work: None ordered   If you have labs (blood work) drawn today and your tests are completely normal, you will receive your results only by: Marland Kitchen MyChart Message (if you have MyChart) OR . A paper copy in the mail If you have any lab test that is abnormal or we need to change your treatment, we will call you to review the results.  Testing/Procedures: None ordered   Follow-Up: At [facility], you and your health needs are our priority.  As part of our continuing mission to provide you with exceptional heart care, we have created designated Provider Care Teams.  These Care Teams include your primary Cardiologist (physician) and Advanced Practice Providers (APPs -  Physician Assistants and Nurse Practitioners) who all work together to provide you with the care you need, when you need it. You will need a follow up appointment in 6 months. You may see Ida Rogue, MD or one of the following Advanced Practice Providers on your designated Care Team:   Murray Hodgkins, NP Christell Faith, PA-C . Marrianne Mood, PA-C

## 2018-11-23 ENCOUNTER — Ambulatory Visit: Payer: Medicare Other | Admitting: Cardiovascular Disease

## 2019-01-07 DIAGNOSIS — G250 Essential tremor: Secondary | ICD-10-CM | POA: Insufficient documentation

## 2019-04-22 ENCOUNTER — Telehealth: Payer: Self-pay | Admitting: Cardiovascular Disease

## 2019-04-22 NOTE — Telephone Encounter (Signed)
Spoke with patient. States the dizziness issue has been ongoing throughout his life depending on the circumstances.  Patient reports he frequently gets dizzy going from sitting to standing positions especially in the morning.  He performs exercise and is active with no problems. He saw his PCP, Dr Loney Hering, a few weeks ago who advised him to take his BP sitting and immediately standing.  Patient reports a drop in BP consistently. Sitting 148/67, HR 57  Standing 129/80,  HR 64.  He is scheduled for virtual visit with Dr Rockey Situ 6/15. Encouraged him to have those readings available. Advised him to stay well hydrated and move slowing when changing positions and he verbalized understanding.

## 2019-04-22 NOTE — Telephone Encounter (Signed)
STAT if patient feels like he/she is going to faint   1) Are you dizzy now? No   2) Do you feel faint or have you passed out? Faint at times no syncope   3) Do you have any other symptoms? Per patient internal gyroscope off   4) Have you checked your HR and BP (record if available)?  Sitting 148/67  HR 57  Standing 129/80 HR 64   Scheduled 6/15 at 11 am with [facility] for Virtual Visit

## 2019-04-22 NOTE — Telephone Encounter (Signed)
Virtual Visit Pre-Appointment Phone Call  "(Name), I am calling you today to discuss your upcoming appointment. We are currently trying to limit exposure to the virus that causes COVID-19 by seeing patients at home rather than in the office."  1. "What is the BEST phone number to call the day of the visit?" - include this in appointment notes  2. Do you have or have access to (through a family member/friend) a smartphone with video capability that we can use for your visit?" a. If yes - list this number in appt notes as cell (if different from BEST phone #) and list the appointment type as a VIDEO visit in appointment notes b. If no - list the appointment type as a PHONE visit in appointment notes  3. Confirm consent - "In the setting of the current Covid19 crisis, you are scheduled for a (phone or video) visit with your provider on (date) at (time).  Just as we do with many in-office visits, in order for you to participate in this visit, we must obtain consent.  If you'd like, I can send this to your mychart (if signed up) or email for you to review.  Otherwise, I can obtain your verbal consent now.  All virtual visits are billed to your insurance company just like a normal visit would be.  By agreeing to a virtual visit, we'd like you to understand that the technology does not allow for your provider to perform an examination, and thus may limit your provider's ability to fully assess your condition. If your provider identifies any concerns that need to be evaluated in person, we will make arrangements to do so.  Finally, though the technology is pretty good, we cannot assure that it will always work on either your or our end, and in the setting of a video visit, we may have to convert it to a phone-only visit.  In either situation, we cannot ensure that we have a secure connection.  Are you willing to proceed?" STAFF: Did the patient verbally acknowledge consent to telehealth visit? Document  YES/NO here: YES  4. Advise patient to be prepared - "Two hours prior to your appointment, go ahead and check your blood pressure, pulse, oxygen saturation, and your weight (if you have the equipment to check those) and write them all down. When your visit starts, your provider will ask you for this information. If you have an Apple Watch or Kardia device, please plan to have heart rate information ready on the day of your appointment. Please have a pen and paper handy nearby the day of the visit as well."  5. Give patient instructions for MyChart download to smartphone OR Doximity/Doxy.me as below if video visit (depending on what platform provider is using)  6. Inform patient they will receive a phone call 15 minutes prior to their appointment time (may be from unknown caller ID) so they should be prepared to answer    TELEPHONE CALL NOTE  Brett Ray has been deemed a candidate for a follow-up tele-health visit to limit community exposure during the Covid-19 pandemic. I spoke with the patient via phone to ensure availability of phone/video source, confirm preferred email & phone number, and discuss instructions and expectations.  I reminded Brett Ray to be prepared with any vital sign and/or heart rhythm information that could potentially be obtained via home monitoring, at the time of his visit. I reminded Brett Ray to expect a phone call prior to his visit.  [name] 04/22/2019 9:25 AM   INSTRUCTIONS FOR DOWNLOADING THE MYCHART APP TO SMARTPHONE  - The patient must first make sure to have activated MyChart and know their login information - If Apple, go to [employer] and type in MyChart in the search bar and download the app. If Android, ask patient to go to [employer] and type in [place] in the search bar and download the app. The app is free but as with any other app downloads, their phone may require them to verify saved payment information or Apple/Android  password.  - The patient will need to then log into the app with their MyChart username and password, and select [facility] as their healthcare provider to link the account. When it is time for your visit, go to the MyChart app, find appointments, and click Begin Video Visit. Be sure to Select Allow for your device to access the Microphone and Camera for your visit. You will then be connected, and your provider will be with you shortly.  **If they have any issues connecting, or need assistance please contact MyChart service desk [phone])**  **If using a computer, in order to ensure the best quality for their visit they will need to use either of the following Internet Browsers: [employer], or Google Chrome**  IF USING DOXIMITY or DOXY.ME - The patient will receive a link just prior to their visit by text.     FULL LENGTH CONSENT FOR TELE-HEALTH VISIT   I hereby voluntarily request, consent and authorize [facility] and its employed or contracted physicians, physician assistants, nurse practitioners or other licensed health care professionals (the Practitioner), to provide me with telemedicine health care services (the Services") as deemed necessary by the treating Practitioner. I acknowledge and consent to receive the Services by the Practitioner via telemedicine. I understand that the telemedicine visit will involve communicating with the Practitioner through live audiovisual communication technology and the disclosure of certain medical information by electronic transmission. I acknowledge that I have been given the opportunity to request an in-person assessment or other available alternative prior to the telemedicine visit and am voluntarily participating in the telemedicine visit.  I understand that I have the right to withhold or withdraw my consent to the use of telemedicine in the course of my care at any time, without affecting my right to future care or treatment,  and that the Practitioner or I may terminate the telemedicine visit at any time. I understand that I have the right to inspect all information obtained and/or recorded in the course of the telemedicine visit and may receive copies of available information for a reasonable fee.  I understand that some of the potential risks of receiving the Services via telemedicine include:   Delay or interruption in medical evaluation due to technological equipment failure or disruption;  Information transmitted may not be sufficient (e.g. poor resolution of images) to allow for appropriate medical decision making by the Practitioner; and/or   In rare instances, security protocols could fail, causing a breach of personal health information.  Furthermore, I acknowledge that it is my responsibility to provide information about my medical history, conditions and care that is complete and accurate to the best of my ability. I acknowledge that Practitioner's advice, recommendations, and/or decision may be based on factors not within their control, such as incomplete or inaccurate data provided by me or distortions of diagnostic images or specimens that may result from electronic transmissions. I understand that the  practice of medicine is not an Chief Strategy Officer and that Practitioner makes no warranties or guarantees regarding treatment outcomes. I acknowledge that I will receive a copy of this consent concurrently upon execution via email to the email address I last provided but may also request a printed copy by calling the office of [facility].    I understand that my insurance will be billed for this visit.   I have read or had this consent read to me.  I understand the contents of this consent, which adequately explains the benefits and risks of the Services being provided via telemedicine.   I have been provided ample opportunity to ask questions regarding this consent and the Services and have had my questions  answered to my satisfaction.  I give my informed consent for the services to be provided through the use of telemedicine in my medical care  By participating in this telemedicine visit I agree to the above.

## 2019-04-28 NOTE — Progress Notes (Signed)
Virtual Visit via Telephone Note   This visit type was conducted due to national recommendations for restrictions regarding the COVID-19 Pandemic (e.g. social distancing) in an effort to limit this patient's exposure and mitigate transmission in our community.  Due to his co-morbid illnesses, this patient is at least at moderate risk for complications without adequate follow up.  This format is felt to be most appropriate for this patient at this time.  The patient did not have access to video technology/had technical difficulties with video requiring transitioning to audio format only (telephone).  All issues noted in this document were discussed and addressed.  No physical exam could be performed with this format.  Please refer to the patient's chart for his  consent to telehealth for [facility].   I connected with  Brett Ray on 04/29/19 by a video enabled telemedicine application and verified that I am speaking with the correct person using two identifiers. I discussed the limitations of evaluation and management by telemedicine. The patient expressed understanding and agreed to proceed.   Evaluation Performed:  Follow-up visit  Date:  04/29/2019   ID:  Brett Ray, DOB [birth year], MRN [id number]  Patient Location:  [place] [address]   Provider location:   [facility], [place] office  PCP:  Derinda Late, MD  Cardiologist:  Arvid Right [facility]   Chief Complaint:  Dizzy   History of Present Illness:    Brett Ray is a 85 y.o. male who presents via audio/video conferencing for a telehealth visit today.   The patient does not symptoms concerning for COVID-19 infection (fever, chills, cough, or new SHORTNESS OF BREATH).   Patient has a past medical history of Hypokalemia Depression Hypothyroid Hx of syncope after flomax, viagra, ETOH Chronic asymptomatic bradycardia Presents for f/u of his syncope, orthostasis  Dizzy in the  Am,when  Dehydrated Symptoms better with hydration  146/67 sitting Pulse 60 Standing 116 to 130  Drinks gatoraide  Long hx of rare  orthostasis Active exercise bike, everyday  The past year, "balance issue" Seen by ENT  Other past medical history reviewed  hospital November 2019 for syncope Felt flush after drinking several glasses of wine on empty stomach With sitting, tried to get up to go to the bathroom had acute syncope EMS called BP-91/56 initially,   Sat him up in a chair had syncope again He reported having Viagra 40 mg prior to the party, was also on Flomax Bradycardia rate 48 Reports having chronic bradycardia, typically is asymptomatic  In the hospital No arrhythmias on telemetry.   Echocardiogram Left ventricle: The cavity size was mildly dilated. Wall thickness was normal. Systolic function was normal. The estimated ejection fraction was in the range of 55% to 60%. Wall motion was normal; there were no regional wall motion abnormalities. Left ventricular diastolic function parameters were normal. - Aortic valve: There was mild regurgitation. - Mitral valve: There was moderate regurgitation. - Right ventricle: The cavity size was mildly dilated. Wall thickness was normal. - Right atrium: The atrium was mildly dilated. - Pulmonary arteries: Systolic pressure was mildly to moderately increased. PA peak pressure: 45 mm Hg (S).  Previously walked around the office monitoring heart rate which increased from 48 bpm at rest up to 70 bpm, 1 lap around the office  Orthostatics in the past 182/89 heart rate 46 supine 193/92 heart rate 48 sitting 163/79 heart rate 51 standing 156/85 heart rate 51 standing 3 minutes  Prior CV studies:   The following studies were reviewed today:    Past Medical History:  Diagnosis Date  . Asymptomatic Sinus bradycardia   . Hyperlipidemia   . Hypothyroidism   . Moderate mitral regurgitation    a. 09/2018  Echo: Mod MR.  . Orthostasis   . Syncope    a. 09/2018 syncope in the setting of wine/empty stomach/sildenafil/orthostasis; b. 09/2018 Echo: EF 55-60%, no rwma, mild AI, mod MR, mildly dil RV/RA. PASP 56mmHg; c. 09/2018 Carotid U/S: <39% bilat ICA dzs.   Past Surgical History:  Procedure Laterality Date  . APPENDECTOMY    . SHOULDER ARTHROSCOPY       No outpatient medications have been marked as taking for the 04/29/19 encounter (Telemedicine) with Minna Merritts, MD.     Allergies:   Patient has no known allergies.   Social History   Tobacco Use  . Smoking status: Never Smoker  . Smokeless tobacco: Never Used  Substance Use Topics  . Alcohol use: Yes    Alcohol/week: 2.0 standard drinks    Types: 2 Glasses of wine per week  . Drug use: Never     Current Outpatient Medications on File Prior to Visit  Medication Sig Dispense Refill  . aspirin EC 81 MG tablet Take 81 mg by mouth daily.    . bimatoprost (LUMIGAN) 0.03 % ophthalmic solution Place 1 drop into both eyes at bedtime.    . calcium carbonate (TUMS - DOSED IN MG ELEMENTAL CALCIUM) 500 MG chewable tablet Chew 1 tablet by mouth 2 (two) times daily as needed for indigestion or heartburn.    . citalopram (CELEXA) 10 MG tablet Take 1 tablet by mouth daily.  3  . finasteride (PROSCAR) 5 MG tablet Take 1 tablet (5 mg total) by mouth daily. 30 tablet 0  . fluticasone (FLONASE SENSIMIST) 27.5 MCG/SPRAY nasal spray Place 2 sprays into the nose daily.    Marland Kitchen levothyroxine (SYNTHROID, LEVOTHROID) 125 MCG tablet Take 125 mcg by mouth daily before breakfast.    . meloxicam (MOBIC) 15 MG tablet Take 1 tablet by mouth daily.  1  . polyethylene glycol (MIRALAX / GLYCOLAX) packet Take 17 g by mouth daily.    . vitamin C (ASCORBIC ACID) 500 MG tablet Take 500 mg by mouth daily.     No current facility-administered medications on file prior to visit.      Family Hx: The patient's family history is not on file.  ROS:   Please see the  history of present illness.    Review of Systems  Constitutional: Negative.   HENT: Negative.   Respiratory: Negative.   Cardiovascular: Negative.   Gastrointestinal: Negative.   Musculoskeletal: Negative.   Neurological: Negative.   Psychiatric/Behavioral: Negative.   All other systems reviewed and are negative.    Labs/Other Tests and Data Reviewed:    Recent Labs: 09/22/2018: ALT 15; BUN 10; Creatinine, Ser 0.75; Hemoglobin 11.7; Magnesium 1.9; Platelets 226; Potassium 3.3; Sodium 134   Recent Lipid Panel No results found for: CHOL, TRIG, HDL, CHOLHDL, LDLCALC, LDLDIRECT  Wt Readings from Last 3 Encounters:  11/02/18 163 lb (73.9 kg)  10/01/18 161 lb 8 oz (73.3 kg)  09/23/18 155 lb 4.8 oz (70.4 kg)     Exam:    Vital Signs: Vital signs may also be detailed in the HPI There were no vitals taken for this visit.  Wt Readings from Last 3 Encounters:  11/02/18 163 lb (73.9 kg)  10/01/18 161 lb 8  oz (73.3 kg)  09/23/18 155 lb 4.8 oz (70.4 kg)   Temp Readings from Last 3 Encounters:  09/23/18 98 F (36.7 C) (Oral)   BP Readings from Last 3 Encounters:  11/02/18 140/70  10/01/18 (!) 175/83  09/23/18 (!) 142/67   Pulse Readings from Last 3 Encounters:  11/02/18 (!) 49  10/01/18 (!) 46  09/23/18 76     Well nourished, well developed male in no acute distress. Constitutional:  oriented to person, place, and time. No distress.    ASSESSMENT & PLAN:    Bradycardia -  Asymptomatic bradycardia  Orthostasis - Checking orthostatics at home, these numbers are improved by drinking Gatorade  Syncope, unspecified syncope type -  No further episodes of syncope but he is having rare orthostatic episodes Better with hydration  Preventive care For screening discussed with him, Non-smoker, nondiabetic We will arrange a CT coronary calcium score   COVID-19 Education: The signs and symptoms of COVID-19 were discussed with the patient and how to seek care for testing  (follow up with PCP or arrange E-visit).  The importance of social distancing was discussed today.  Patient Risk:   After full review of this patients clinical status, I feel that they are at least moderate risk at this time.  Time:   Today, I have spent 25 minutes with the patient with telehealth technology discussing the cardiac and medical problems/diagnoses detailed above   10 min spent reviewing the chart prior to patient visit today   Medication Adjustments/Labs and Tests Ordered: Current medicines are reviewed at length with the patient today.  Concerns regarding medicines are outlined above.   Tests Ordered: No tests ordered   Medication Changes: No changes made   Disposition: Follow-up as needed   Signed, Ida Rogue, MD  04/29/2019 11:46 AM    [facility] [address] [place] [address], [place], [place] [address]

## 2019-04-29 ENCOUNTER — Other Ambulatory Visit: Payer: Self-pay

## 2019-04-29 ENCOUNTER — Telehealth (INDEPENDENT_AMBULATORY_CARE_PROVIDER_SITE_OTHER): Payer: Medicare Other | Admitting: Cardiovascular Disease

## 2019-04-29 DIAGNOSIS — I951 Orthostatic hypotension: Secondary | ICD-10-CM

## 2019-04-29 DIAGNOSIS — R001 Bradycardia, unspecified: Secondary | ICD-10-CM | POA: Diagnosis not present

## 2019-04-29 DIAGNOSIS — R55 Syncope and collapse: Secondary | ICD-10-CM | POA: Diagnosis not present

## 2019-04-29 NOTE — Patient Instructions (Addendum)
Medication Instructions:  No changes  If you need a refill on your cardiac medications before your next appointment, please call your pharmacy.    Lab work: No new labs needed   If you have labs (blood work) drawn today and your tests are completely normal, you will receive your results only by: Marland Kitchen MyChart Message (if you have MyChart) OR . A paper copy in the mail If you have any lab test that is abnormal or we need to change your treatment, we will call you to review the results.   Testing/Procedures: We will order CT coronary calcium score $150 Family History     Please call [phone] to schedule   [facility] [address], [place] [address]    Follow-Up: At [facility], you and your health needs are our priority.  As part of our continuing mission to provide you with exceptional heart care, we have created designated Provider Care Teams.  These Care Teams include your primary Cardiologist (physician) and Advanced Practice Providers (APPs -  Physician Assistants and Nurse Practitioners) who all work together to provide you with the care you need, when you need it.  . You will need a follow up appointment as needed   . Providers on your designated Care Team:   . Murray Hodgkins, NP . Christell Faith, PA-C . Marrianne Mood, PA-C  Any Other Special Instructions Will Be Listed Below (If Applicable).  For educational health videos Log in to : [email or web address] Or : [email or web address], password : [email or web address]

## 2019-05-28 ENCOUNTER — Encounter: Payer: Self-pay | Admitting: Physical Therapy

## 2019-05-28 ENCOUNTER — Ambulatory Visit: Payer: Medicare Other | Attending: Unknown Physician Specialty | Admitting: Physical Therapy

## 2019-05-28 ENCOUNTER — Other Ambulatory Visit: Payer: Medicare Other

## 2019-05-28 ENCOUNTER — Other Ambulatory Visit: Payer: Self-pay

## 2019-05-28 DIAGNOSIS — R42 Dizziness and giddiness: Secondary | ICD-10-CM | POA: Diagnosis not present

## 2019-05-28 NOTE — Therapy (Signed)
[place] MAIN [facility] SERVICES [address] [place], [place], [address] Phone: [phone]   Fax:  [phone]  Physical Therapy Evaluation  Patient Details  Name: Brett Ray MRN: [id number] Date of Birth: 1931/05/28 Referring Provider (PT): Dr. Gemma Payor   Encounter Date: 05/28/2019  PT End of Session - 05/28/19 0931    Visit Number  1    Number of Visits  9    Date for PT Re-Evaluation  07/23/19    PT Start Time  0931    PT Stop Time  1029    PT Time Calculation (min)  58 min    Equipment Utilized During Treatment  Gait belt    Activity Tolerance  Patient tolerated treatment well    Behavior During Therapy  [facility] for tasks assessed/performed       Past Medical History:  Diagnosis Date  . Asymptomatic Sinus bradycardia   . Hyperlipidemia   . Hypothyroidism   . Moderate mitral regurgitation    a. 09/2018 Echo: Mod MR.  . Orthostasis   . Syncope    a. 09/2018 syncope in the setting of wine/empty stomach/sildenafil/orthostasis; b. 09/2018 Echo: EF 55-60%, no rwma, mild AI, mod MR, mildly dil RV/RA. PASP 59mmHg; c. 09/2018 Carotid U/S: <39% bilat ICA dzs.    Past Surgical History:  Procedure Laterality Date  . APPENDECTOMY    . SHOULDER ARTHROSCOPY      There were no vitals filed for this visit.   Subjective Assessment - 05/28/19 0931    Subjective  Patient reports that he is not having dizziness but states he has constant sensation of imbalance when he is up and some rocking sensation as well.    Pertinent History  Patient reports he has not been dizzy and reports his problem is not with dizziness. Patient reports his symptoms started in February of 2020 with an acute onset that have been persistent and without change. Patient reports he has this constant feeling of imbalance when he stands. Patient reports the imbalance is mostly in standing, but he does have a little in sitting as well. Patient reports that the feeling of  imbalance happens when he is standing still and when he is moving. Patient reports that the unsteadiness sensation is constant and daily and that nothing increases or decreases his symptoms. Patient reports that he does have some mild rocking sensation, but reports he was not on a boat in Jan or Feb of this year prior to onset. Patient states that [place] and that he got motion sickness the first time but took dramamine the second time and did not have any issues. Patient reports he has tried doing self-Epley maneuvers. Patient reports his wife had been treated for BPPV years ago so he thought he would try the Epley maneuvers to see if it would help his symptoms, which patient reports it had no effect on his symptoms.  Patient reported that he saw Dr. Tami Ribas, ENT in regards to his symptoms and that he tried vestibular rehab at the ENT office in spring of this year. Patient reports that he had VNG testing 2 weeks ago and he reports it was negative. Patient reports that he does wobble board exercises for about 5-10 minutes each morning since doing vestibular therapy. Patient states he does mini squats on the wobble board. Patient does report that he had a syncopal episodes last year in December and reports that he frequently feels fain most mornings after  he has been sitting for 10-20 minutes and he goes to stand up. Patient reports that he gets transient dizziness when he first stands up but reports this is different from his complaints of constant imbalance sensation. Patient reports that he tries to stay well hydrated and has been drinking Gatorade and water. In addition, patient reports he gets up every night about every few hours and he will drink 8 ounces of water each time to try to stay hydrated at night. Patient reports he has seen Dr. Rockey Situ, cardiologist and that he had an audio EKG performed 6-7 years ago and then a repeat test December 2019 and that the testing was identical. Patient  states he has also had a negative carotid ultrasound test. Patient reports that next week he is getting a cardiac CT scan.    Diagnostic tests  VNG test which was negative per patient report, US carotid which was negative per patient report, audio EKG and patient reports he is getting a cardiac CT scan next week.    Patient Stated Goals  to have decreased symptoms.    Currently in Pain?  No/denies         [facility] PT Assessment - 05/29/19 1348      Assessment   Medical Diagnosis  dizziness and giddiness    Referring Provider (PT)  Dr. Gemma Payor    Prior Therapy  Spring 2020      Precautions   Precautions  None      Restrictions   Weight Bearing Restrictions  No      Balance Screen   Has the patient fallen in the past 6 months  No    Has the patient had a decrease in activity level because of a fear of falling?   No    Is the patient reluctant to leave their home because of a fear of falling?   No      Home Film/video editor residence    Living Arrangements  Spouse/significant other    Available Help at Discharge  Family;Friend(s)    Type of [place] at the [facility] entry    [place]  One level      Prior Function   Level of Independence  Independent with community mobility without device    Vocation  Retired      Associate Professor   Overall Cognitive Status  Within Functional Limits for tasks assessed      Dynamic Gait Index   Level Surface  Normal    Change in Gait Speed  Normal    Gait with Horizontal Head Turns  Normal    Gait with Vertical Head Turns  Normal    Gait and Pivot Turn  Normal    Step Over Obstacle  Normal    Step Around Obstacles  Normal    Steps  Normal    Total Score  24         VESTIBULAR AND BALANCE EVALUATION   HISTORY:  Subjective history of current problem: Patient reports he has not been dizzy and reports his problem is not with dizziness. Patient reports his symptoms  started in February of 2020 with an acute onset that have been persistent and without change. Patient reports he has this constant feeling of imbalance when he stands. Patient reports the imbalance is mostly in standing, but he does have a little in sitting as well. Patient reports  that the feeling of imbalance happens when he is standing still and when he is moving. Patient reports that the unsteadiness sensation is constant and daily and that nothing increases or decreases his symptoms. Patient reports that he does have some mild rocking sensation, but reports he was not on a boat in Jan or Feb of this year prior to onset. Patient states that he spent two [place] sea and that he got motion sickness the first time but took dramamine the second time and did not have any issues. Patient reports he has tried doing self-Epley maneuvers. Patient reports his wife had been treated for BPPV years ago so he thought he would try the Epley maneuvers to see if it would help his symptoms, which patient reports it had no effect on his symptoms.  Patient reported that he saw Dr. Tami Ribas, ENT in regards to his symptoms and that he tried vestibular rehab at the ENT office in spring of this year. Patient reports that he had VNG testing 2 weeks ago and he reports it was negative. Patient reports that he does wobble board exercises for about 5-10 minutes each morning since doing vestibular therapy. Patient states he does mini squats on the wobble board. Patient does report that he had a syncopal episodes last year in December and reports that he frequently feels fain most mornings after he has been sitting for 10-20 minutes and he goes to stand up. Patient reports that he gets transient dizziness when he first stands up but reports this is different from his complaints of constant imbalance sensation. Patient reports that he tries to stay well hydrated and has been drinking Gatorade and water. In addition, patient reports he gets  up every night about every few hours and he will drink 8 ounces of water each time to try to stay hydrated at night. Patient reports he has seen Dr. Rockey Situ, cardiologist and that he had an audio EKG performed 6-7 years ago and then a repeat test December 2019 and that the testing was identical. Patient states he has also had a negative carotid ultrasound test. Patient reports that next week he is getting a cardiac CT scan.   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 Denies pain or drainage. Patient reports his hearing was good with testing.  Vision (last eye exam, diplopia, recent changes): denies; patient wears glasses.   Current Symptoms: (dysarthria, dysphagia, drop attacks, bowel and bladder changes, recent weight loss/gain)    Review of systems negative for red flags.     EXAMINATION  SOMATOSENSORY:   Any N & T in extremities or weakness: denies      COORDINATION: Finger to Nose: Normal Past Pointing:   Normal Pronator Drift: none  MUSCULOSKELETAL SCREEN: Cervical Spine ROM: AROM cervical flexion, extension, right/left rotation WNL without discomfort.   Gait: Patient arrives ambulating without AD. Patient ambulates with fair cadence and scanning of visual environment.  Balance: Patient is challenged by uneven surfaces, eyes closed, narrow base of support  POSTURAL CONTROL TESTS:  Clinical Test of Sensory Interaction for Balance    (  CTSIB):  CONDITION TIME STRATEGY SWAY  Eyes open, firm surface 30 seconds  ankle +1  Eyes closed, firm surface 30 seconds ankle +2  Eyes open, foam surface 30 seconds ankle +2  Eyes closed, foam surface 30 seconds ankle +3    OCULOMOTOR / VESTIBULAR TESTING:  Oculomotor Exam- Room Light  Normal Abnormal Comments  Ocular Alignment N    Ocular ROM N    Spontaneous Nystagmus N    Gaze evoked Nystagmus N    Smooth Pursuit N    Saccades N    VOR N    VOR Cancellation N  Denies blurring of target  Left Head Impulse N  One corrective saccade noted but was unable to reproduce with repeated testing  Right Head Impulse N      BPPV TESTS: deferred  FUNCTIONAL OUTCOME MEASURES:  Results Comments  DHI     N/A Patient denies dizziness  ABC Scale      100 % normal  DGI       24 /24 normal    VOR X 1 exercise:   Demonstrated and educated as to VOR X1.  Patient performed VOR X 1 horizontal in standing 1 rep of 30 seconds and 2 reps of 1 minute each with verbal cues for technique.  Patient reports the target is staying in focus and denies any changes to his symptoms.     PT Education - 05/28/19 0931    Education Details  Discussed plan of care; discussed strategies to help with orthostatis symptoms that patient reports with sit to stand in the am    Person(s) Educated  Patient    Methods  Explanation;Demonstration    Comprehension  Verbalized understanding       PT Short Term Goals - 05/29/19 1344      PT SHORT TERM GOAL #1   Title  Patient will be able to perform home program independently for self-management.    Time  4    Period  Weeks    Status  New    Target Date  06/25/19        PT Long Term Goals - 05/29/19 1345      PT LONG TERM GOAL #1   Title  Patient will report 50% or greater improvement in his symptoms of dizziness and imbalance with provoking motions or positions.    Time  8    Period  Weeks    Status  New    Target Date  07/23/19             Plan - 05/28/19 0931    Clinical Impression Statement  Patient presents to  the clinic with complaints of constant daily episodes of sensation of imbalance and mild rocking sensation without any aggravating or easing factors that he is aware of. Patient scored 100% on the ABC scale and 24/24 on the DGI which are both normal scores and considered safe for community ambulation. Patient was challenged by activties with eyes closed, narrow BOS and uneven surfaces. Patient would benefit from PT services to try to address goals and functional deficits in order to try to help decrease patient's subjective symptoms.    Personal Factors and Comorbidities  Age;Comorbidity 2    Comorbidities  syncope, chronic assymptomatic bradycardia, orthostasis    Stability/Clinical Decision Making  Stable/Uncomplicated    Clinical Decision Making  Low    Rehab Potential  Fair    PT Frequency  1x / week    PT Duration  8 weeks    PT Treatment/Interventions  Vestibular;Canalith Repostioning;Gait training;Balance training;Therapeutic exercise;Therapeutic activities;Neuromuscular re-education;Patient/family education    Consulted and Agree with Plan of Care  Patient       Patient will benefit from skilled therapeutic intervention in order to improve the following deficits and impairments:     Visit Diagnosis: 1. Dizziness and giddiness        Problem List Patient Active Problem List   Diagnosis Date Noted  . Orthostasis 10/01/2018  . Bradycardia 10/01/2018  . Depression 09/30/2018  . Hypothyroid 09/30/2018  . Syncope 09/22/2018   Lady Deutscher PT, DPT [phone] Lady Deutscher 05/29/2019, 1:51 PM  [place] MAIN [facility] SERVICES [address] [place], [place], [address] Phone: [phone]   Fax:  [phone]  Name: Cristino Degroff MRN: [id number] Date of Birth: May 17, 1931

## 2019-05-30 ENCOUNTER — Telehealth: Payer: Self-pay | Admitting: *Deleted

## 2019-05-30 NOTE — Telephone Encounter (Signed)
Script Screening patients for COVID-19 and reviewing new operational procedures  Greeting - The reason I am calling is to share with you some new changes to our processes that are designed to help us keep everyone safe. Is now a good time to speak with you? Patient says "no' - ask them when you can call back and let them know it's important to do this prior to their appointment.  Patient says "yes" - Great, Mcguire the first thing I need to do is ask you some screening Questions.  1. To the best of your knowledge, have you been in close contact with any one with a confirmed diagnosis of COVID 19? o No - proceed to next question  2. Have you had any one or more of the following: fever, chills, cough, shortness of breath or any flu-like symptoms? o No - proceed to next question  3. Have you been diagnosed with or have a previous diagnosis of COVID 19? o No - proceed to next question  4. I am going to go over a few other symptoms with you. Please let me know if you are experiencing any of the following: . Ear, nose or throat discomfort . A sore throat . Headache . Muscle pain . Diarrhea . Loss of taste or smell o No - proceed to next question  Thank you for answering these questions. Please know we will ask you these questions or similar questions when you arrive for your appointment and again it's how we are keeping everyone safe. Also, to keep you safe, please use the provided hand sanitizer when you enter the building. [place], we are asking everyone in the building to wear a mask because they help us prevent the spread of germs. Do you have a mask of your own, if not, we are happy to provide one for you. The last thing I want to go over with you is the no visitor guidelines. This means no one can attend the appointment with you unless you need physical assistance. I understand this may be different from your past appointments and I know this may be difficult but please know if  someone is driving you we are happy to call them for you once your appointment is over.  [INSERT SITE SPECIFIC CHECK IN PROCEDURES]  Koty I've given you a lot of information, what questions do you have about what I've talked about today or your appointment tomorrow? 

## 2019-05-31 ENCOUNTER — Other Ambulatory Visit: Payer: Self-pay

## 2019-05-31 ENCOUNTER — Ambulatory Visit (INDEPENDENT_AMBULATORY_CARE_PROVIDER_SITE_OTHER)
Admission: RE | Admit: 2019-05-31 | Discharge: 2019-05-31 | Disposition: A | Discharge status: Home / Self Care | Payer: Self-pay | Source: Ambulatory Visit | Attending: Cardiovascular Disease | Admitting: Cardiovascular Disease

## 2019-05-31 DIAGNOSIS — R001 Bradycardia, unspecified: Secondary | ICD-10-CM

## 2019-05-31 DIAGNOSIS — R55 Syncope and collapse: Secondary | ICD-10-CM

## 2019-06-04 ENCOUNTER — Ambulatory Visit: Payer: Medicare Other | Admitting: Physical Therapy

## 2019-06-04 ENCOUNTER — Telehealth: Payer: Self-pay | Admitting: Cardiovascular Disease

## 2019-06-04 ENCOUNTER — Other Ambulatory Visit: Payer: Self-pay

## 2019-06-04 ENCOUNTER — Encounter: Payer: Self-pay | Admitting: Physical Therapy

## 2019-06-04 DIAGNOSIS — R42 Dizziness and giddiness: Secondary | ICD-10-CM | POA: Diagnosis not present

## 2019-06-04 MED ORDER — ROSUVASTATIN CALCIUM 20 MG PO TABS: 20.0000 mg | ORAL_TABLET | Freq: Every day | ORAL | 3 refills | Status: DC

## 2019-06-04 NOTE — Therapy (Signed)
[place] MAIN [facility] SERVICES [address] [place], [place], [address] Phone: [phone]   Fax:  [phone]  Physical Therapy Treatment  Patient Details  Name: Brett Ray MRN: [id number] Date of Birth: 12-31-1930 Referring Provider (PT): Dr. Gemma Payor   Encounter Date: 06/04/2019  PT End of Session - 06/04/19 1005    Visit Number  2    Number of Visits  9    Date for PT Re-Evaluation  07/23/19    PT Start Time  1004    PT Stop Time  1048    PT Time Calculation (min)  44 min    Equipment Utilized During Treatment  Gait belt    Activity Tolerance  Patient tolerated treatment well    Behavior During Therapy  [facility] for tasks assessed/performed       Past Medical History:  Diagnosis Date  . Asymptomatic Sinus bradycardia   . Hyperlipidemia   . Hypothyroidism   . Moderate mitral regurgitation    a. 09/2018 Echo: Mod MR.  . Orthostasis   . Syncope    a. 09/2018 syncope in the setting of wine/empty stomach/sildenafil/orthostasis; b. 09/2018 Echo: EF 55-60%, no rwma, mild AI, mod MR, mildly dil RV/RA. PASP 49mmHg; c. 09/2018 Carotid U/S: <39% bilat ICA dzs.    Past Surgical History:  Procedure Laterality Date  . APPENDECTOMY    . SHOULDER ARTHROSCOPY      There were no vitals filed for this visit.  Subjective Assessment - 06/04/19 1004    Subjective  Patient reports that he had his heart scan test and reports that the findings were good, but states he is going to start taking a statin drug again. Patient states his disequilibrium symptoms usually do not change and reports that he does not have any difficulty with his symptoms while driving or being a passenger in a car. Patient also reports that he had no changes to his medications prior to the onset of his symptoms.    Pertinent History  Patient reports he has not been dizzy and reports his problem is not with dizziness. Patient reports his symptoms started in February of 2020 with an  acute onset that have been persistent and without change. Patient reports he has this constant feeling of imbalance when he stands. Patient reports the imbalance is mostly in standing, but he does have a little in sitting as well. Patient reports that the feeling of imbalance happens when he is standing still and when he is moving. Patient reports that the unsteadiness sensation is constant and daily and that nothing increases or decreases his symptoms. Patient reports that he does have some mild rocking sensation, but reports he was not on a boat in Jan or Feb of this year prior to onset. Patient states that he [place] and that he got motion sickness the first time but took dramamine the second time and did not have any issues. Patient reports he has tried doing self-Epley maneuvers. Patient reports his wife had been treated for BPPV years ago so he thought he would try the Epley maneuvers to see if it would help his symptoms, which patient reports it had no effect on his symptoms.  Patient reported that he saw Dr. Tami Ribas, ENT in regards to his symptoms and that he tried vestibular rehab at the ENT office in spring of this year. Patient reports that he had VNG testing 2 weeks ago and he reports it was negative. Patient reports  that he does wobble board exercises for about 5-10 minutes each morning since doing vestibular therapy. Patient states he does mini squats on the wobble board. Patient does report that he had a syncopal episodes last year in December and reports that he frequently feels fain most mornings after he has been sitting for 10-20 minutes and he goes to stand up. Patient reports that he gets transient dizziness when he first stands up but reports this is different from his complaints of constant imbalance sensation. Patient reports that he tries to stay well hydrated and has been drinking Gatorade and water. In addition, patient reports he gets up every night about every few hours  and he will drink 8 ounces of water each time to try to stay hydrated at night. Patient reports he has seen Dr. Rockey Situ, cardiologist and that he had an audio EKG performed 6-7 years ago and then a repeat test December 2019 and that the testing was identical. Patient states he has also had a negative carotid ultrasound test. Patient reports that next week he is getting a cardiac CT scan.    Diagnostic tests  VNG test which was negative per patient report, US carotid which was negative per patient report, audio EKG and patient reports he is getting a cardiac CT scan next week.    Patient Stated Goals  to have decreased symptoms.       Neuromuscular Re-education:  Worked on a variety of balance and vestibular activities to try to elicit patient's symptoms in the clinic.  On Airex pad, viewed optokinetic stripes in horizontal and vertical pattern and then repeated activity while doing ant/post sways on medium sized wooden rockerboard. Patient reports no change in his symptoms with these activities. Worked on medium sized wooden rocker board side to side and ant/post sways with eyes open and eyes closed for 1-2 minute reps.   Hallway ball toss:  In hallway, worked on ball toss against one wall with alternating quick turns to toss ball against opposite wall while tracking with eyes and head. Patient reports mild increase in symptoms with this activity.  Body Wall Rolls:  Patient performed 4 reps of supported, body wall rolls.  Patient reports increase in his disequilibrium symptoms with this activity.  Issued body wall rolls for HEP.   Diona Foley toss over shoulder: Patient performed multiple 44' trials of forward and retro ambulation while tossing ball over one shoulder with return catch over opposite shoulder with CGA. Patient reports increase in symptoms with retro ambulation. Patient with mild evidence of imbalance with this activity.     PT Education - 06/04/19 1005    Education Details  Patient  educated as to body wall rolls for HEP; handout provided    Person(s) Educated  Patient    Methods  Explanation;Handout;Verbal cues;Demonstration    Comprehension  Verbalized understanding;Returned demonstration       PT Short Term Goals - 05/29/19 1344      PT SHORT TERM GOAL #1   Title  Patient will be able to perform home program independently for self-management.    Time  4    Period  Weeks    Status  New    Target Date  06/25/19        PT Long Term Goals - 05/29/19 1345      PT LONG TERM GOAL #1   Title  Patient will report 50% or greater improvement in his symptoms of dizziness and imbalance with provoking motions or positions.  Time  8    Period  Weeks    Status  New    Target Date  07/23/19            Plan - 06/04/19 1911    Clinical Impression Statement  Patient reports mild transient increase in symptoms with body wall rolls, retro ambulation with ball toss over shouder and ball toss against wall with alternating quick turns. Patient issued body wall rolls for HEP. Will try vestibular rehab to see if vestibular and balance exercises can help to impact patient's subjective symptoms.    Personal Factors and Comorbidities  Age;Comorbidity 2    Comorbidities  syncope, chronic assymptomatic bradycardia, orthostasis    Stability/Clinical Decision Making  Stable/Uncomplicated    Rehab Potential  Fair    PT Frequency  1x / week    PT Duration  8 weeks    PT Treatment/Interventions  Vestibular;Canalith Repostioning;Gait training;Balance training;Therapeutic exercise;Therapeutic activities;Neuromuscular re-education;Patient/family education    Consulted and Agree with Plan of Care  Patient       Patient will benefit from skilled therapeutic intervention in order to improve the following deficits and impairments:  Decreased balance, Dizziness  Visit Diagnosis: 1. Dizziness and giddiness        Problem List Patient Active Problem List   Diagnosis Date Noted   . Orthostasis 10/01/2018  . Bradycardia 10/01/2018  . Depression 09/30/2018  . Hypothyroid 09/30/2018  . Syncope 09/22/2018   Lady Deutscher PT, DPT [phone] Lady Deutscher 06/04/2019, 7:32 PM  [place] MAIN [facility] SERVICES [address] [place], [place], [address] Phone: [phone]   Fax:  [phone]  Name: Jw Covin MRN: [id number] Date of Birth: [birth year]

## 2019-06-04 NOTE — Telephone Encounter (Signed)
I spoke with the patient regarding his CT Calcium score. He is agreeable with Dr. Donivan Scull recommendations to start Crestor 20 mg once daily. He would like this sent to [facility] on [address]- # 90 day supply. He is aware I will reach out to him in 3 months to schedule his lab follow up.  He is also agreeable with an echocardiogram in 1 year to re-evaluate his aorta. I have advised this will go in a recall for him and we will be in touch to schedule closer to that time. I advised we can set him up to see Dr. Rockey Situ just after that as well to go over the results.   The patient voices understanding of all of the above and is agreeable.  He would like a copy of his report sent to Dr. Loney Hering (PCP) & Dr. Alla German (cardiology).

## 2019-06-04 NOTE — Telephone Encounter (Signed)
Notes recorded by Minna Merritts, MD on 06/02/2019 at 8:41 AM EDT  CT coronary calcium score  Coronary calcium score of 907.  This was 61% percentile for age and sex matched control.  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

## 2019-06-11 ENCOUNTER — Other Ambulatory Visit: Payer: Self-pay

## 2019-06-11 ENCOUNTER — Ambulatory Visit: Payer: Medicare Other | Admitting: Physical Therapy

## 2019-06-11 ENCOUNTER — Encounter: Payer: Self-pay | Admitting: Physical Therapy

## 2019-06-11 DIAGNOSIS — R42 Dizziness and giddiness: Secondary | ICD-10-CM | POA: Diagnosis not present

## 2019-06-11 NOTE — Therapy (Signed)
[place] MAIN [facility] SERVICES [address] [place], [place], [address] Phone: [phone]   Fax:  [phone]  Physical Therapy Treatment/Discharge Summary PHYSICAL THERAPY DISCHARGE SUMMARY  Visits from Start of Care: 3  Patient Details  Name: Brett Ray MRN: [id number] Date of Birth: March 03, 1931 Referring Provider (PT): Dr. Gemma Payor   Encounter Date: 06/11/2019  PT End of Session - 06/11/19 1003    Visit Number  3    Number of Visits  9    Date for PT Re-Evaluation  07/23/19    Authorization Type  3/10 progress notes    PT Start Time  1002    PT Stop Time  1034    PT Time Calculation (min)  32 min    Equipment Utilized During Treatment  Gait belt    Activity Tolerance  Patient tolerated treatment well    Behavior During Therapy  WFL for tasks assessed/performed       Past Medical History:  Diagnosis Date  . Asymptomatic Sinus bradycardia   . Hyperlipidemia   . Hypothyroidism   . Moderate mitral regurgitation    a. 09/2018 Echo: Mod MR.  . Orthostasis   . Syncope    a. 09/2018 syncope in the setting of wine/empty stomach/sildenafil/orthostasis; b. 09/2018 Echo: EF 55-60%, no rwma, mild AI, mod MR, mildly dil RV/RA. PASP 40mHg; c. 09/2018 Carotid U/S: <39% bilat ICA dzs.    Past Surgical History:  Procedure Laterality Date  . APPENDECTOMY    . SHOULDER ARTHROSCOPY      There were no vitals filed for this visit.  Subjective Assessment - 06/11/19 1004    Subjective  Patient reports his symptoms have been the same. Patient reports when he does the body wall rolls with eyes open he gets constant dizziness while doing and it resolves within about 3 seconds upon stopping, but patient reports this is different from his constant symptom of more imbalance. Patient states "it feels like my internal gyroscope is off"    Pertinent History  Patient reports he has not been dizzy and reports his problem is not with dizziness. Patient  reports his symptoms started in February of 2020 with an acute onset that have been persistent and without change. Patient reports he has this constant feeling of imbalance when he stands. Patient reports the imbalance is mostly in standing, but he does have a little in sitting as well. Patient reports that the feeling of imbalance happens when he is standing still and when he is moving. Patient reports that the unsteadiness sensation is constant and daily and that nothing increases or decreases his symptoms. Patient reports that he does have some mild rocking sensation, but reports he was not on a boat in Jan or Feb of this year prior to onset. Patient states that he [place] two summ[place] sea and that he got motion sickness the first time but took dramamine the second time and did not have any issues. Patient reports he has tried doing self-Epley maneuvers. Patient reports his wife had been treated for BPPV years ago so he thought he would try the Epley maneuvers to see if it would help his symptoms, which patient reports it had no effect on his symptoms.  Patient reported that he saw Dr. MTami Ribas ENT in regards to his symptoms and that he tried vestibular rehab at the ENT office in spring of this year. Patient reports that he had VNG testing 2 weeks ago and he reports it was  negative. Patient reports that he does wobble board exercises for about 5-10 minutes each morning since doing vestibular therapy. Patient states he does mini squats on the wobble board. Patient does report that he had a syncopal episodes last year in December and reports that he frequently feels fain most mornings after he has been sitting for 10-20 minutes and he goes to stand up. Patient reports that he gets transient dizziness when he first stands up but reports this is different from his complaints of constant imbalance sensation. Patient reports that he tries to stay well hydrated and has been drinking Gatorade and water. In addition,  patient reports he gets up every night about every few hours and he will drink 8 ounces of water each time to try to stay hydrated at night. Patient reports he has seen Dr. Rockey Situ, cardiologist and that he had an audio EKG performed 6-7 years ago and then a repeat test December 2019 and that the testing was identical. Patient states he has also had a negative carotid ultrasound test. Patient reports that next week he is getting a cardiac CT scan.    Diagnostic tests  VNG test which was negative per patient report, US carotid which was negative per patient report, audio EKG and patient reports he is getting a cardiac CT scan next week.    Patient Stated Goals  to have decreased symptoms.       Neuromuscular Re-education:  Hallway ball toss:  In hallway standing on Airex pad, worked on ball toss against one wall with alternating quick turns to toss ball against opposite wall while tracking with eyes and head.  Body Wall Rolls:  Patient performed 4 reps of supported, body wall rolls with eyes closed.  Patient reports min dizziness while doing the activity, but reports it stops when he stops the activity and reports that it did not change or impact his symptoms of disequilibrium/ imbalance.  Ball toss over shoulder: Patient performed multiple 175' trials of forward and retro ambulation while tossing ball over one shoulder with return catch over opposite shoulder varying the ball position to head, shoulder and waist level to promote head turning and tilting. With help of an assistant, worked on bounce passes and quick passes in varying positions so that patient moved his head in diagonals, vertically and horizontally and quickly.     PT Education - 06/11/19 1005    Education Details  Discussed patient's symptoms and what he does for his daily exercises at home. discussed discharge plans and patient in agreement    Person(s) Educated  Patient    Methods  Explanation    Comprehension  Verbalized  understanding       PT Short Term Goals - 06/13/19 1536      PT SHORT TERM GOAL #1   Title  Patient will be able to perform home program independently for self-management.    Time  4    Period  Weeks    Status  Achieved    Target Date  06/25/19        PT Long Term Goals - 06/13/19 1537      PT LONG TERM GOAL #1   Title  Patient will report 50% or greater improvement in his symptoms of dizziness and imbalance with provoking motions or positions.    Time  8    Period  Weeks    Status  Not Met        Plan - 06/12/19 1451    Clinical  Impression Statement  Patient continues to report constant sensation of dysequilibrium/ imbalance. Attempted 2 sessions of vestibular therapy to see if could elicit and reproduce patient's symptoms in the clinic; however, despite attempting high level vestibular exercises we were unable to elicit patient's symptoms. Patient was issued home exercise program and he is independent with the exercises. In addition, patient described and demonstrated exercises that he does daily at home each morning and he has a good assortment of strengthening and balance exercises. Encouraged patient to continue to perform these exercises. Patient has met 1/1 short term goals and did not meet 1/1 long term goal of achieiving 50% improvement in his overall symptoms. Patient's reports of constant symptoms that do not vary in much in intensity do not seem to be indicative of a vestibular source of patient's symptoms. Encouraged patient to continue to follow-up with his physicians in regards to these symptoms. Patient in agreement with discharge from PT services at this time.    Personal Factors and Comorbidities  Age;Comorbidity 2    Comorbidities  syncope, chronic assymptomatic bradycardia, orthostasis    Stability/Clinical Decision Making  Stable/Uncomplicated    Rehab Potential  Fair    PT Frequency  1x / week    PT Duration  8 weeks    PT Treatment/Interventions   Vestibular;Canalith Repostioning;Gait training;Balance training;Therapeutic exercise;Therapeutic activities;Neuromuscular re-education;Patient/family education    Consulted and Agree with Plan of Care  Patient              Patient will benefit from skilled therapeutic intervention in order to improve the following deficits and impairments:     Visit Diagnosis: 1. Dizziness and giddiness        Problem List Patient Active Problem List   Diagnosis Date Noted  . Orthostasis 10/01/2018  . Bradycardia 10/01/2018  . Depression 09/30/2018  . Hypothyroid 09/30/2018  . Syncope 09/22/2018   Lady Deutscher PT, DPT [phone] Lady Deutscher 06/11/2019, 10:38 AM  [place] MAIN [facility] SERVICES [address] [place], [place], [address] Phone: [phone]   Fax:  [phone]  Name: Brett Ray MRN: [id number] Date of Birth: Aug 24, 1931

## 2019-06-25 ENCOUNTER — Encounter: Payer: Medicare Other | Admitting: Physical Therapy

## 2019-07-02 ENCOUNTER — Encounter: Payer: Medicare Other | Admitting: Physical Therapy

## 2019-07-09 ENCOUNTER — Encounter: Payer: Medicare Other | Admitting: Physical Therapy

## 2019-08-21 ENCOUNTER — Telehealth: Payer: Self-pay | Admitting: Cardiovascular Disease

## 2019-08-21 NOTE — Telephone Encounter (Signed)
Virtual Visit Pre-Appointment Phone Call  "(Name), I am calling you today to discuss your upcoming appointment. We are currently trying to limit exposure to the virus that causes COVID-19 by seeing patients at home rather than in the office."  1. "What is the BEST phone number to call the day of the visit?" - include this in appointment notes  2. Do you have or have access to (through a family member/friend) a smartphone with video capability that we can use for your visit?" a. If yes - list this number in appt notes as cell (if different from BEST phone #) and list the appointment type as a VIDEO visit in appointment notes b. If no - list the appointment type as a PHONE visit in appointment notes  3. Confirm consent - "In the setting of the current Covid19 crisis, you are scheduled for a (phone or video) visit with your provider on (date) at (time).  Just as we do with many in-office visits, in order for you to participate in this visit, we must obtain consent.  If you'd like, I can send this to your mychart (if signed up) or email for you to review.  Otherwise, I can obtain your verbal consent now.  All virtual visits are billed to your insurance company just like a normal visit would be.  By agreeing to a virtual visit, we'd like you to understand that the technology does not allow for your provider to perform an examination, and thus may limit your provider's ability to fully assess your condition. If your provider identifies any concerns that need to be evaluated in person, we will make arrangements to do so.  Finally, though the technology is pretty good, we cannot assure that it will always work on either your or our end, and in the setting of a video visit, we may have to convert it to a phone-only visit.  In either situation, we cannot ensure that we have a secure connection.  Are you willing to proceed?" STAFF: Did the patient verbally acknowledge consent to telehealth visit? Document  YES/NO here: YES  4. Advise patient to be prepared - "Two hours prior to your appointment, go ahead and check your blood pressure, pulse, oxygen saturation, and your weight (if you have the equipment to check those) and write them all down. When your visit starts, your provider will ask you for this information. If you have an Apple Watch or Kardia device, please plan to have heart rate information ready on the day of your appointment. Please have a pen and paper handy nearby the day of the visit as well."  5. Give patient instructions for MyChart download to smartphone OR [email or web address] as below if video visit (depending on what platform provider is using)  6. Inform patient they will receive a phone call 15 minutes prior to their appointment time (may be from unknown caller ID) so they should be prepared to answer    TELEPHONE CALL NOTE  Pe[place]on Edgin has been deemed a candidate for a follow-up tele-health visit to limit community exposure during the Covid-19 pandemic. I spoke with the patient via phone to ensure availability of phone/video source, confirm preferred email & phone number, and discuss instructions and expectations.  I reminded Masaichi Kracht to be prepared with any vital sign and/or heart rhythm information that could potentially be obtained via home monitoring, at the time of his visit. I reminded Maddux Vanscyoc to expect a phone call prior to his visit.  [name] 08/21/2019 10:22 AM   INSTRUCTIONS FOR DOWNLOADING THE MYCHART APP TO SMARTPHONE  - The patient must first make sure to have activated MyChart and know their login information - If Apple, go to [employer] and type in MyChart in the search bar and download the app. If Android, ask patient to go to [employer] and type in [place] in the search bar and download the app. The app is free but as with any other app downloads, their phone may require them to verify saved payment information or Apple/Android  password.  - The patient will need to then log into the app with their MyChart username and password, and select [facility] as their healthcare provider to link the account. When it is time for your visit, go to the MyChart app, find appointments, and click Begin Video Visit. Be sure to Select Allow for your device to access the Microphone and Camera for your visit. You will then be connected, and your provider will be with you shortly.  **If they have any issues connecting, or need assistance please contact MyChart service desk [phone])**  **If using a computer, in order to ensure the best quality for their visit they will need to use either of the following Internet Browsers: [employer], or Google Chrome**  IF USING DOXIMITY or DOXY.ME - The patient will receive a link just prior to their visit by text.     FULL LENGTH CONSENT FOR TELE-HEALTH VISIT   I hereby voluntarily request, consent and authorize [facility] and its employed or contracted physicians, physician assistants, nurse practitioners or other licensed health care professionals (the Practitioner), to provide me with telemedicine health care services (the Services") as deemed necessary by the treating Practitioner. I acknowledge and consent to receive the Services by the Practitioner via telemedicine. I understand that the telemedicine visit will involve communicating with the Practitioner through live audiovisual communication technology and the disclosure of certain medical information by electronic transmission. I acknowledge that I have been given the opportunity to request an in-person assessment or other available alternative prior to the telemedicine visit and am voluntarily participating in the telemedicine visit.  I understand that I have the right to withhold or withdraw my consent to the use of telemedicine in the course of my care at any time, without affecting my right to future care or treatment,  and that the Practitioner or I may terminate the telemedicine visit at any time. I understand that I have the right to inspect all information obtained and/or recorded in the course of the telemedicine visit and may receive copies of available information for a reasonable fee.  I understand that some of the potential risks of receiving the Services via telemedicine include:   Delay or interruption in medical evaluation due to technological equipment failure or disruption;  Information transmitted may not be sufficient (e.g. poor resolution of images) to allow for appropriate medical decision making by the Practitioner; and/or   In rare instances, security protocols could fail, causing a breach of personal health information.  Furthermore, I acknowledge that it is my responsibility to provide information about my medical history, conditions and care that is complete and accurate to the best of my ability. I acknowledge that Practitioner's advice, recommendations, and/or decision may be based on factors not within their control, such as incomplete or inaccurate data provided by me or distortions of diagnostic images or specimens that may result from electronic transmissions. I understand that the  practice of medicine is not an Chief Strategy Officer and that Practitioner makes no warranties or guarantees regarding treatment outcomes. I acknowledge that I will receive a copy of this consent concurrently upon execution via email to the email address I last provided but may also request a printed copy by calling the office of [facility].    I understand that my insurance will be billed for this visit.   I have read or had this consent read to me.  I understand the contents of this consent, which adequately explains the benefits and risks of the Services being provided via telemedicine.   I have been provided ample opportunity to ask questions regarding this consent and the Services and have had my questions  answered to my satisfaction.  I give my informed consent for the services to be provided through the use of telemedicine in my medical care  By participating in this telemedicine visit I agree to the above.

## 2019-08-28 ENCOUNTER — Encounter: Payer: Self-pay | Admitting: Cardiovascular Disease

## 2019-08-28 ENCOUNTER — Telehealth (INDEPENDENT_AMBULATORY_CARE_PROVIDER_SITE_OTHER): Payer: Medicare Other | Admitting: Cardiovascular Disease

## 2019-08-28 VITALS — BP 164/69 | HR 70 | Ht 70.0 in | Wt 156.0 lb

## 2019-08-28 DIAGNOSIS — I951 Orthostatic hypotension: Secondary | ICD-10-CM | POA: Diagnosis not present

## 2019-08-28 DIAGNOSIS — R001 Bradycardia, unspecified: Secondary | ICD-10-CM | POA: Diagnosis not present

## 2019-08-28 DIAGNOSIS — E782 Mixed hyperlipidemia: Secondary | ICD-10-CM

## 2019-08-28 DIAGNOSIS — R55 Syncope and collapse: Secondary | ICD-10-CM

## 2019-08-28 DIAGNOSIS — I25118 Atherosclerotic heart disease of native coronary artery with other forms of angina pectoris: Secondary | ICD-10-CM

## 2019-08-28 NOTE — Progress Notes (Signed)
Virtual Visit via Telephone Note   This visit type was conducted due to national recommendations for restrictions regarding the COVID-19 Pandemic (e.g. social distancing) in an effort to limit this patient's exposure and mitigate transmission in our community.  Due to his co-morbid illnesses, this patient is at least at moderate risk for complications without adequate follow up.  This format is felt to be most appropriate for this patient at this time.  The patient did not have access to video technology/had technical difficulties with video requiring transitioning to audio format only (telephone).  All issues noted in this document were discussed and addressed.  No physical exam could be performed with this format.  Please refer to the patient's chart for his  consent to telehealth for [facility].   I connected with  Brett Ray on 08/28/19 by a video enabled telemedicine application and verified that I am speaking with the correct person using two identifiers. I discussed the limitations of evaluation and management by telemedicine. The patient expressed understanding and agreed to proceed.   Evaluation Performed:  Follow-up visit  Date:  08/28/2019   ID:  Brett Ray, DOB 1931/07/30, MRN [id number]  Patient Location:  [place] [address]   Provider location:   [facility], [place] office  PCP:  Derinda Late, MD  Cardiologist:  Arvid [facility]  Chief Complaint:  Calcium score   History of Present Illness:    Brett Ray is a 85 y.o. male who presents via audio/video conferencing for a telehealth visit today.   The patient does not symptoms concerning for COVID-19 infection (fever, chills, cough, or new SHORTNESS OF BREATH).   Patient has a past medical history of Hypokalemia Depression Hypothyroid Hx of syncope after flomax, viagra, ETOH  09/2018 Chronic asymptomatic bradycardia Long hx of rare  orthostasis Active exercise  bike, everyday  Presents for f/u of his syncope, orthostasis, coronary calcification  CT coronary calcium score discussed with him Coronary calcium score of 907.  This was 61% percentile for age and sex matched control.  Mildly dilated ascending aorta  Current total cholesterol 230  LDL is 140   we recommended he start Crestor 20 mg daily  Over the summer, episodes of fatigue, Unusual happened so quickly  Denies angina sx, Still dizzy with standing for 15 sec.  Dizzy in the Am,when  Dehydrated Symptoms better with hydration Drinks gatorade  The past year, "balance issue" Seen by ENT  Other past medical history reviewed  hospital November 2019 for syncope Felt flush after drinking several glasses of wine on empty stomach With sitting, tried to get up to go to the bathroom had acute syncope EMS called BP-91/56 initially,   Sat him up in a chair had syncope again He reported having Viagra 40 mg prior to the party, was also on Flomax Bradycardia rate 48 Reports having chronic bradycardia, typically is asymptomatic  In the hospital No arrhythmias on telemetry.   Echocardiogram Left ventricle: The cavity size was mildly dilated. Wall thickness was normal. Systolic function was normal. The estimated ejection fraction was in the range of 55% to 60%. Wall motion was normal; there were no regional wall motion abnormalities. Left ventricular diastolic function parameters were normal. - Aortic valve: There was mild regurgitation. - Mitral valve: There was moderate regurgitation. - Right ventricle: The cavity size was mildly dilated. Wall thickness was normal. - Right atrium: The atrium was mildly dilated. - Pulmonary arteries: Systolic pressure was  mildly to moderately increased. PA peak pressure: 45 mm Hg (S).  Previously walked around the office monitoring heart rate which increased from 48 bpm at rest up to 70 bpm, 1 lap around the office   Prior CV  studies:   The following studies were reviewed today:   Past Medical History:  Diagnosis Date  . Asymptomatic Sinus bradycardia   . Hyperlipidemia   . Hypothyroidism   . Moderate mitral regurgitation    a. 09/2018 Echo: Mod MR.  . Orthostasis   . Syncope    a. 09/2018 syncope in the setting of wine/empty stomach/sildenafil/orthostasis; b. 09/2018 Echo: EF 55-60%, no rwma, mild AI, mod MR, mildly dil RV/RA. PASP 93mmHg; c. 09/2018 Carotid U/S: <39% bilat ICA dzs.   Past Surgical History:  Procedure Laterality Date  . APPENDECTOMY    . SHOULDER ARTHROSCOPY       Current Meds  Medication Sig  . aspirin EC 81 MG tablet Take 81 mg by mouth daily.  . bimatoprost (LUMIGAN) 0.03 % ophthalmic solution Place 1 drop into both eyes at bedtime.  . calcium carbonate (TUMS - DOSED IN MG ELEMENTAL CALCIUM) 500 MG chewable tablet Chew 1 tablet by mouth 2 (two) times daily as needed for indigestion or heartburn.  . citalopram (CELEXA) 10 MG tablet Take 1 tablet by mouth daily.  . finasteride (PROSCAR) 5 MG tablet Take 1 tablet (5 mg total) by mouth daily.  . fluticasone (FLONASE SENSIMIST) 27.5 MCG/SPRAY nasal spray Place 2 sprays into the nose daily.  Marland Kitchen levothyroxine (SYNTHROID, LEVOTHROID) 125 MCG tablet Take 125 mcg by mouth daily before breakfast.  . LUMIGAN 0.01 % SOLN INT 1 GTT IN OU QPM  . meloxicam (MOBIC) 15 MG tablet Take 1 tablet by mouth daily.  . polyethylene glycol (MIRALAX / GLYCOLAX) packet Take 17 g by mouth daily.  . rosuvastatin (CRESTOR) 20 MG tablet Take 1 tablet (20 mg total) by mouth daily.  . vitamin C (ASCORBIC ACID) 500 MG tablet Take 500 mg by mouth daily.     Allergies:   Patient has no known allergies.   Social History   Tobacco Use  . Smoking status: Never Smoker  . Smokeless tobacco: Never Used  Substance Use Topics  . Alcohol use: Yes    Alcohol/week: 2.0 standard drinks    Types: 2 Glasses of wine per week  . Drug use: Never     Current Outpatient  Medications on File Prior to Visit  Medication Sig Dispense Refill  . aspirin EC 81 MG tablet Take 81 mg by mouth daily.    . bimatoprost (LUMIGAN) 0.03 % ophthalmic solution Place 1 drop into both eyes at bedtime.    . calcium carbonate (TUMS - DOSED IN MG ELEMENTAL CALCIUM) 500 MG chewable tablet Chew 1 tablet by mouth 2 (two) times daily as needed for indigestion or heartburn.    . citalopram (CELEXA) 10 MG tablet Take 1 tablet by mouth daily.  3  . finasteride (PROSCAR) 5 MG tablet Take 1 tablet (5 mg total) by mouth daily. 30 tablet 0  . fluticasone (FLONASE SENSIMIST) 27.5 MCG/SPRAY nasal spray Place 2 sprays into the nose daily.    Marland Kitchen levothyroxine (SYNTHROID, LEVOTHROID) 125 MCG tablet Take 125 mcg by mouth daily before breakfast.    . LUMIGAN 0.01 % SOLN INT 1 GTT IN OU QPM    . meloxicam (MOBIC) 15 MG tablet Take 1 tablet by mouth daily.  1  . polyethylene glycol (MIRALAX / GLYCOLAX) packet  Take 17 g by mouth daily.    . rosuvastatin (CRESTOR) 20 MG tablet Take 1 tablet (20 mg total) by mouth daily. 90 tablet 3  . vitamin C (ASCORBIC ACID) 500 MG tablet Take 500 mg by mouth daily.     No current facility-administered medications on file prior to visit.      Family Hx: The patient's family history is not on file.  ROS:   Please see the history of present illness.    Review of Systems  Constitutional: Negative.   HENT: Negative.   Respiratory: Negative.   Cardiovascular: Negative.   Gastrointestinal: Negative.   Musculoskeletal: Negative.   Neurological: Negative.   Psychiatric/Behavioral: Negative.   All other systems reviewed and are negative.    Labs/Other Tests and Data Reviewed:    Recent Labs: 09/22/2018: ALT 15; BUN 10; Creatinine, Ser 0.75; Hemoglobin 11.7; Magnesium 1.9; Platelets 226; Potassium 3.3; Sodium 134   Recent Lipid Panel No results found for: CHOL, TRIG, HDL, CHOLHDL, LDLCALC, LDLDIRECT   Exam:    Vital Signs: Vital signs may also be detailed in  the HPI BP (!) 164/69   Pulse 70   Ht 5\' 10"  (1.778 m)   Wt 156 lb (70.8 kg)   BMI 22.38 kg/m   Wt Readings from Last 3 Encounters:  08/28/19 156 lb (70.8 kg)  11/02/18 163 lb (73.9 kg)  10/01/18 161 lb 8 oz (73.3 kg)    Temp Readings from Last 3 Encounters:  09/23/18 98 F (36.7 C) (Oral)   BP Readings from Last 3 Encounters:  08/28/19 (!) 164/69  11/02/18 140/70  10/01/18 (!) 175/83   Pulse Readings from Last 3 Encounters:  08/28/19 70  11/02/18 (!) 49  10/01/18 (!) 22     Well nourished, well developed male in no acute distress. Constitutional:  oriented to person, place, and time. No distress.    ASSESSMENT & PLAN:    Coronary calcification on CT scan Denies anginal symptoms Started on Crestor, goal LDL less than 70 Discussed anginal symptoms to watch for Other risk factors; non-smoker nondiabetic  Bradycardia -  Asymptomatic bradycardia stable  Orthostasis - Checking orthostatics at home, these numbers are improved by drinking Gatorade  Syncope, unspecified syncope type -  No further episodes of syncope but he is having rare orthostatic episodes Better with hydration   COVID-19 Education: The signs and symptoms of COVID-19 were discussed with the patient and how to seek care for testing (follow up with PCP or arrange E-visit).  The importance of social distancing was discussed today.  Patient Risk:   After full review of this patients clinical status, I feel that they are at least moderate risk at this time.  Time:   Today, I have spent 25 minutes with the patient with telehealth technology discussing the cardiac and medical problems/diagnoses detailed above   10 min spent reviewing the chart prior to patient visit today   Medication Adjustments/Labs and Tests Ordered: Current medicines are reviewed at length with the patient today.  Concerns regarding medicines are outlined above.   Tests Ordered: No tests ordered  Medication Changes: No  changes made  Disposition: Follow-up 12 months   Signed, Ida Rogue, MD  08/28/2019 8:04 AM    [place] Office [address] [place], [place], [place] [address]

## 2019-08-28 NOTE — Patient Instructions (Addendum)
Medication Instructions:  No changes  If you need a refill on your cardiac medications before your next appointment, please call your pharmacy.    Lab work: No new labs needed   If you have labs (blood work) drawn today and your tests are completely normal, you will receive your results only by: Marland Kitchen MyChart Message (if you have MyChart) OR . A paper copy in the mail If you have any lab test that is abnormal or we need to change your treatment, we will call you to review the results.   Testing/Procedures: No new testing needed   Follow-Up: At [facility], you and your health needs are our priority.  As part of our continuing mission to provide you with exceptional heart care, we have created designated Provider Care Teams.  These Care Teams include your primary Cardiologist (physician) and Advanced Practice Providers (APPs -  Physician Assistants and Nurse Practitioners) who all work together to provide you with the care you need, when you need it.  . You will need a follow up appointment in 12 months (October 2021) .   Please call our office 2 months in advance to schedule this appointment.  (Call in early August 2021 to schedule)  . Providers on your designated Care Team:   . Murray Hodgkins, NP . Christell Faith, PA-C . Marrianne Mood, PA-C  Any Other Special Instructions Will Be Listed Below (If Applicable).  For educational health videos Log in to : [email or web address] Or : [email or web address], password : [email or web address]

## 2020-05-16 ENCOUNTER — Other Ambulatory Visit: Payer: Self-pay | Admitting: Cardiovascular Disease

## 2020-05-28 DIAGNOSIS — S76012A Strain of muscle, fascia and tendon of left hip, initial encounter: Secondary | ICD-10-CM | POA: Insufficient documentation

## 2020-05-28 DIAGNOSIS — M7062 Trochanteric bursitis, left hip: Secondary | ICD-10-CM | POA: Insufficient documentation

## 2020-06-22 ENCOUNTER — Telehealth: Payer: Self-pay | Admitting: Cardiovascular Disease

## 2020-06-22 NOTE — Telephone Encounter (Signed)
Spoke with patient and reviewed concerns and instructed him to monitor blood pressures with changes in positions. Instructed him to keep a log and bring those in to his next visit on Wednesday. Also advised to please hold onto something due when changing positions. He verbalized understanding of conversation, agreement with plan, and had no further questions at this time.

## 2020-06-22 NOTE — Telephone Encounter (Signed)
STAT if patient feels like he/she is going to faint   1) Are you dizzy now?  No   2) Do you feel faint or have you passed out?  No   3) Do you have any other symptoms?  No   4) Have you checked your HR and BP (record if available)? See pcp note for recent vs runs normal     Patient states this happens when position changes and he take 1-2 minutes before getting back to normal after getting up from lying or sitting   Scheduled 8/11 at [address]

## 2020-06-22 NOTE — Telephone Encounter (Signed)
Thanks! I updated appt notes to include orthostatics.

## 2020-06-23 ENCOUNTER — Other Ambulatory Visit: Payer: Self-pay

## 2020-06-23 ENCOUNTER — Ambulatory Visit: Payer: Medicare Other | Admitting: Cardiovascular Disease

## 2020-06-23 ENCOUNTER — Encounter: Payer: Self-pay | Admitting: Cardiovascular Disease

## 2020-06-23 VITALS — BP 167/73 | HR 56 | Ht 70.25 in | Wt 163.0 lb

## 2020-06-23 DIAGNOSIS — I25118 Atherosclerotic heart disease of native coronary artery with other forms of angina pectoris: Secondary | ICD-10-CM

## 2020-06-23 MED ORDER — ROSUVASTATIN CALCIUM 20 MG PO TABS: 20.0000 mg | ORAL_TABLET | Freq: Every day | ORAL | 3 refills | Status: DC

## 2020-06-23 NOTE — Patient Instructions (Addendum)
Medication Instructions:  No changes  Please move the finasteride to before bed   If you need a refill on your cardiac medications before your next appointment, please call your pharmacy.    Lab work: No new labs needed   If you have labs (blood work) drawn today and your tests are completely normal, you will receive your results only by: Marland Kitchen MyChart Message (if you have MyChart) OR . A paper copy in the mail If you have any lab test that is abnormal or we need to change your treatment, we will call you to review the results.   Testing/Procedures: No new testing needed   Follow-Up: At [facility], you and your health needs are our priority.  As part of our continuing mission to provide you with exceptional heart care, we have created designated Provider Care Teams.  These Care Teams include your primary Cardiologist (physician) and Advanced Practice Providers (APPs -  Physician Assistants and Nurse Practitioners) who all work together to provide you with the care you need, when you need it.  . You will need a follow up appointment in 12 months  . Providers on your designated Care Team:   . Murray Hodgkins, NP . Christell Faith, PA-C . Marrianne Mood, PA-C  Any Other Special Instructions Will Be Listed Below (If Applicable).  COVID-19 Vaccine Information can be found at: ShippingScam.co.uk For questions related to vaccine distribution or appointments, please email vaccine@[email or web address] or call [phone].

## 2020-06-23 NOTE — Progress Notes (Signed)
Date:  06/23/2020   ID:  [name], DOB 02/07/1931, MRN [id number]  Patient Location:  [place] [address]   Provider location:   [facility], [place] office  PCP:  Derinda Late, MD  Cardiologist:  Arvid Right [facility]  Chief Complaint  Patient presents with  . office visit    Patient reports dizziness from sitting to standing position; Meds verbally reviewed with patient.    History of Present Illness:    Brett Ray is a 86 y.o. male  past medical history of Hypokalemia Depression Hypothyroid Hx of syncope after flomax, viagra, ETOH  09/2018 Chronic asymptomatic bradycardia Long hx of rare  orthostasis Active exercise bike, everyday  calcium score 900 in 05/2019 Chronic dizziness Presents for f/u of his syncope, orthostasis, coronary calcification  In follow-up today reports he is doing well  Continues to suffer with chronic dizziness in the morning Reports that after he wakes in AM, no dizziness Exercise 40 min in Am Had breakfast, takes medication,   shower, Reads 15 min After he gets up from reading will then developed dizziness, Symptoms resolve early afternoon as the day progresses  Stays hydrated Does not think dizziness is from dehydration  Is in the office today, No orthostasis  At home, BP when having sx with standing in the 130 range  Total chol 230 to 160 on crestor 20 No side effects to the Crestor  CT coronary calcium reviewed with him Coronary calcium score of 907.  This was 61% percentile for age and sex matched control.  Mildly dilated ascending aorta  EKG personally reviewed by myself on todays visit Shows normal sinus rhythm rate 56 bpm right bundle branch block, LVH, PVC  Other past medical history reviewed  hospital November 2019 for syncope Felt flush after drinking several glasses of wine on empty stomach With sitting, tried to get up to go to the bathroom had acute syncope EMS  called BP-91/56 initially,   Sat him up in a chair had syncope again He reported having Viagra 40 mg prior to the party, was also on Flomax Bradycardia rate 48 Reports having chronic bradycardia, typically is asymptomatic  In the hospital No arrhythmias on telemetry.   Echocardiogram Left ventricle: The cavity size was mildly dilated. Wall thickness was normal. Systolic function was normal. The estimated ejection fraction was in the range of 55% to 60%. Wall motion was normal; there were no regional wall motion abnormalities. Left ventricular diastolic function parameters were normal. - Aortic valve: There was mild regurgitation. - Mitral valve: There was moderate regurgitation. - Right ventricle: The cavity size was mildly dilated. Wall thickness was normal. - Right atrium: The atrium was mildly dilated. - Pulmonary arteries: Systolic pressure was mildly to moderately increased. PA peak pressure: 45 mm Hg (S).  Previously walked around the office monitoring heart rate which increased from 48 bpm at rest up to 70 bpm, 1 lap around the office   Prior CV studies:   The following studies were reviewed today:   Past Medical History:  Diagnosis Date  . Asymptomatic Sinus bradycardia   . Hyperlipidemia   . Hypothyroidism   . Moderate mitral regurgitation    a. 09/2018 Echo: Mod MR.  . Orthostasis   . Syncope    a. 09/2018 syncope in the setting of wine/empty stomach/sildenafil/orthostasis; b. 09/2018 Echo: EF 55-60%, no rwma, mild AI, mod MR, mildly dil RV/RA. PASP 55mmHg; c. 09/2018 Carotid U/S: <39% bilat  ICA dzs.   Past Surgical History:  Procedure Laterality Date  . APPENDECTOMY    . SHOULDER ARTHROSCOPY       Current Meds  Medication Sig  . aspirin EC 81 MG tablet Take 81 mg by mouth daily.  . bimatoprost (LUMIGAN) 0.03 % ophthalmic solution Place 1 drop into both eyes in the morning and at bedtime.  . calcium carbonate (TUMS - DOSED IN MG ELEMENTAL  CALCIUM) 500 MG chewable tablet Chew 1 tablet by mouth 2 (two) times daily as needed for indigestion or heartburn.  . citalopram (CELEXA) 10 MG tablet Take 1 tablet by mouth daily.  . finasteride (PROSCAR) 5 MG tablet Take 5 mg by mouth daily.  . fluticasone (FLONASE SENSIMIST) 27.5 MCG/SPRAY nasal spray Place 2 sprays into the nose daily.  Marland Kitchen levothyroxine (SYNTHROID, LEVOTHROID) 125 MCG tablet Take 125 mcg by mouth daily before breakfast.  . meloxicam (MOBIC) 15 MG tablet Take 1 tablet by mouth daily.  . polyethylene glycol (MIRALAX / GLYCOLAX) packet Take 17 g by mouth daily.  . Probiotic Product (PROBIOTIC DAILY PO) Take by mouth daily.  . rosuvastatin (CRESTOR) 20 MG tablet Take 1 tablet (20 mg total) by mouth at bedtime.  . vitamin C (ASCORBIC ACID) 500 MG tablet Take 500 mg by mouth daily.  . [DISCONTINUED] rosuvastatin (CRESTOR) 20 MG tablet TAKE 1 TABLET(20 MG) BY MOUTH DAILY     Allergies:   Patient has no known allergies.   Social History   Tobacco Use  . Smoking status: Never Smoker  . Smokeless tobacco: Never Used  Vaping Use  . Vaping Use: Never used  Substance Use Topics  . Alcohol use: Yes    Alcohol/week: 2.0 standard drinks    Types: 2 Glasses of wine per week    Comment: every evening  . Drug use: Never     Family Hx: The patient's family history is not on file.  ROS:   Please see the history of present illness.    Review of Systems  Constitutional: Negative.   HENT: Negative.   Respiratory: Negative.   Cardiovascular: Negative.   Gastrointestinal: Negative.   Musculoskeletal: Negative.   Neurological: Negative.   Psychiatric/Behavioral: Negative.   All other systems reviewed and are negative.    Labs/Other Tests and Data Reviewed:    Recent Labs: No results found for requested labs within last 8760 hours.   Recent Lipid Panel No results found for: CHOL, TRIG, HDL, CHOLHDL, LDLCALC, LDLDIRECT   Exam:    Vital Signs: Vital signs may also be  detailed in the HPI BP (!) 167/73 (BP Location: Left Arm, Patient Position: Sitting, Cuff Size: Normal)   Pulse (!) 56   Ht 5' 10.25" (1.784 m)   Wt 163 lb (73.9 kg)   SpO2 98%   BMI 23.22 kg/m   Constitutional:  oriented to person, place, and time. No distress.  HENT:  Head: Grossly normal Eyes:  no discharge. No scleral icterus.  Neck: No JVD, no carotid bruits  Cardiovascular: Regular rate and rhythm, no murmurs appreciated Pulmonary/Chest: Clear to auscultation bilaterally, no wheezes or rails Abdominal: Soft.  no distension.  no tenderness.  Musculoskeletal: Normal range of motion Neurological:  normal muscle tone. Coordination normal. No atrophy Skin: Skin warm and dry Psychiatric: normal affect, pleasant  ASSESSMENT & PLAN:    Coronary calcification on CT scan On Crestor, cholesterol at goal Denies anginal symptoms  Chronic dizziness Etiology unclear, seem to start after he takes his morning medications  Recommend he move one by one the medications to the evening He will start with his prostate medication first Less likely heart arrhythmia, he is not orthostatic Was having symptoms even with blood pressure 030 systolic  Bradycardia -  Asymptomatic  Orthostasis - No documentation of recent orthostasis, he is hydrating well  Syncope, unspecified syncope type -  No further episodes, recommended he stay hydrated   Total encounter time more than 25 minutes  Greater than 50% was spent in counseling and coordination of care with the patient   Signed, Ida Rogue, MD  06/23/2020 7:07 PM    [place] Office [place] [address], [place], [place] [address]

## 2020-06-24 ENCOUNTER — Ambulatory Visit: Payer: Medicare Other | Admitting: Family

## 2020-07-02 NOTE — Telephone Encounter (Signed)
Patient reports that after changing medications to night time he is doing well and appreciates the great care he has received .

## 2020-08-15 ENCOUNTER — Other Ambulatory Visit: Payer: Self-pay | Admitting: Cardiovascular Disease

## 2020-09-01 ENCOUNTER — Ambulatory Visit: Payer: Medicare Other | Admitting: Cardiovascular Disease

## 2021-01-04 IMAGING — CT CT HEART SCORING
2 series · 16 of 20 positions shown, 18 images · non-contrast
Comparison: None.
COMPARISON: None.

Addendum:
EXAM:
OVER-READ INTERPRETATION  CT CHEST

The following report is an over-read performed by radiologist Dr.
Henny Melissa [REDACTED] on 05/31/2019. This
over-read does not include interpretation of cardiac or coronary
anatomy or pathology. The coronary calcium score interpretation by
the cardiologist is attached.
CLINICAL DATA: Risk stratification
Coronary Calcium Score
TECHNIQUE: The patient was scanned on a Siemens Force scanner. Axial
non-contrast 3 mm slices were carried out through the heart. The
data set was analyzed on a dedicated work station and scored using
the Agatson method.

[Series 3: casc 3.0 i36f 2 bestdiast 70 % · axial · 0.32mm/px · z∈[-158,-48]mm · 8 of 49 slices shown, 10 images]
[im 6/49  vessel]
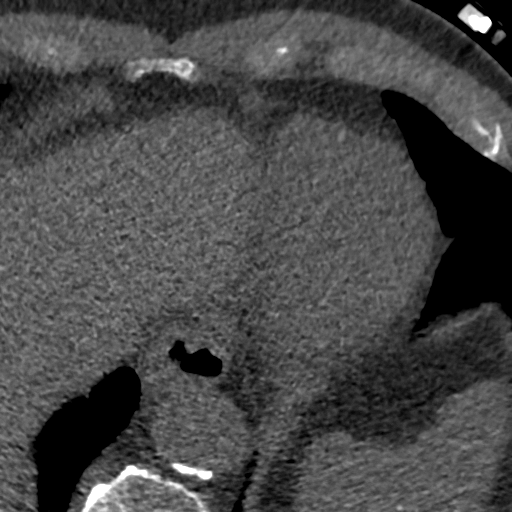
[im 6/49  lung]
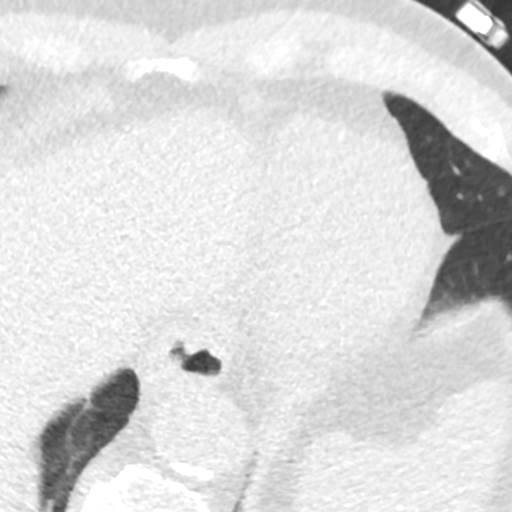
[im 11/49  vessel]
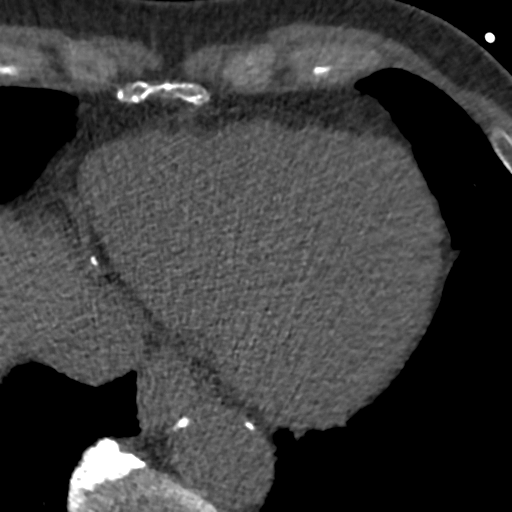
[im 17/49  vessel]
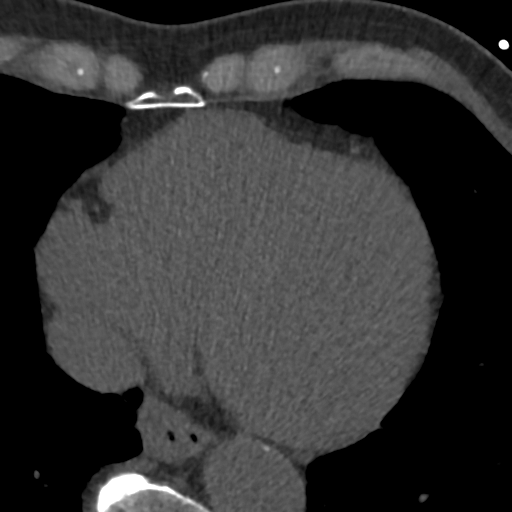
[im 22/49  vessel]
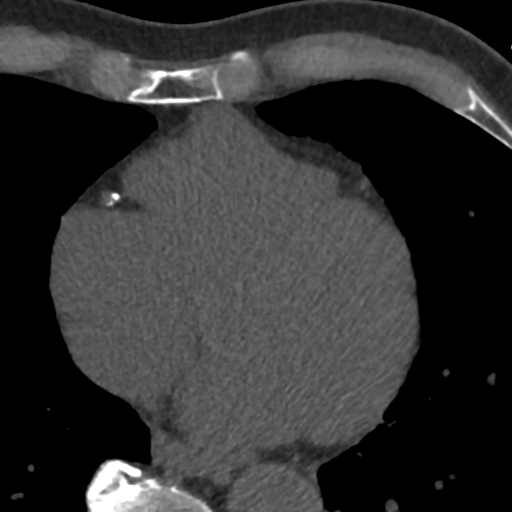
[im 27/49  vessel]
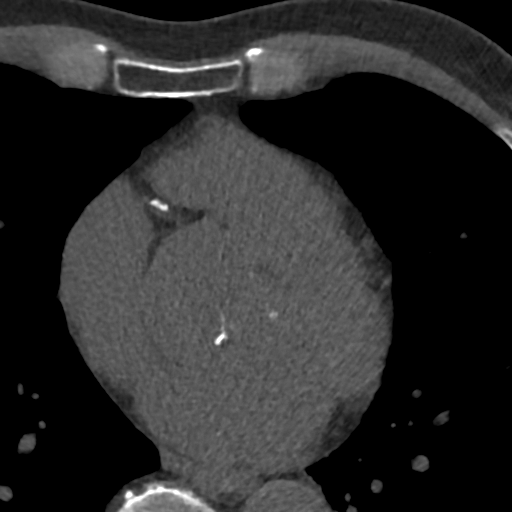
[im 27/49  lung]
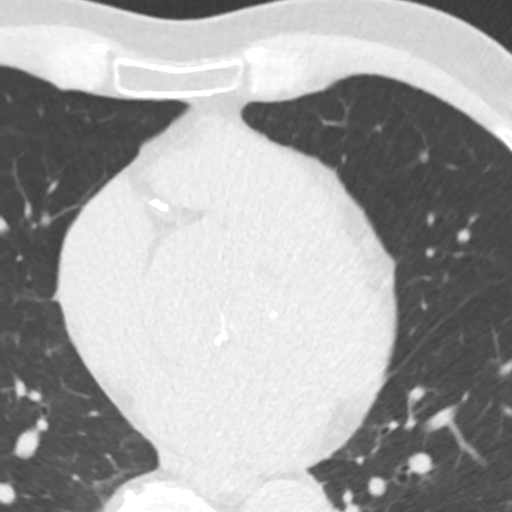
[im 33/49  vessel]
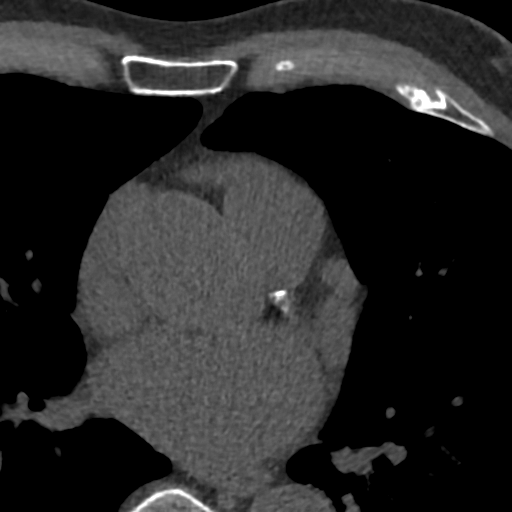
[im 38/49  vessel]
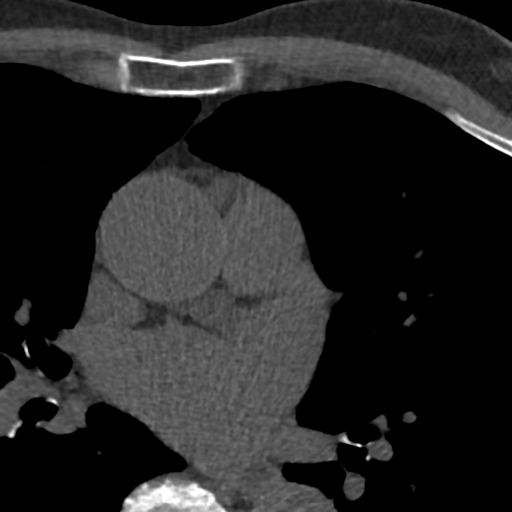
[im 43/49  vessel]
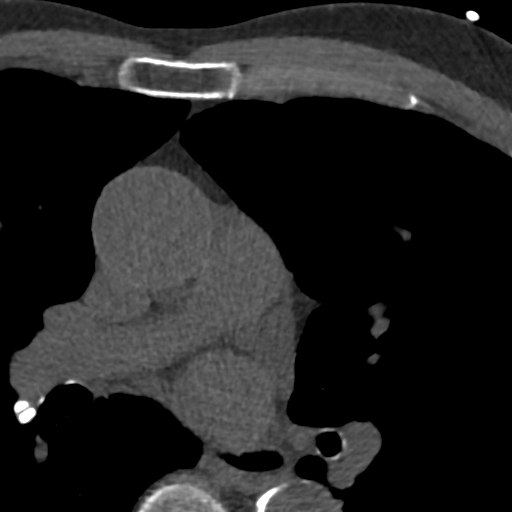

[Series 5: lung st 70 % · axial · 0.68mm/px · z∈[-158,-48]mm · 8 of 49 slices shown]
[im 6/49  lung]
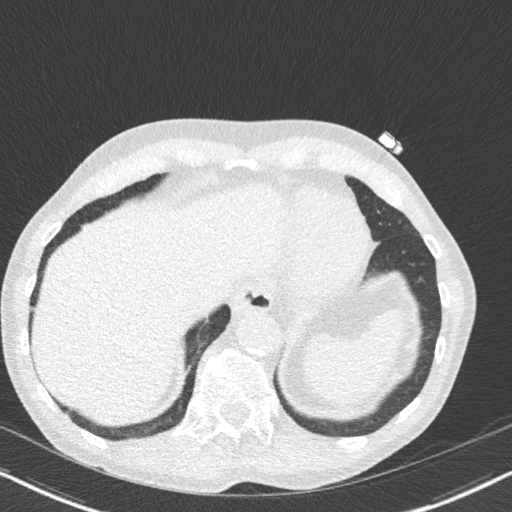
[im 11/49  lung]
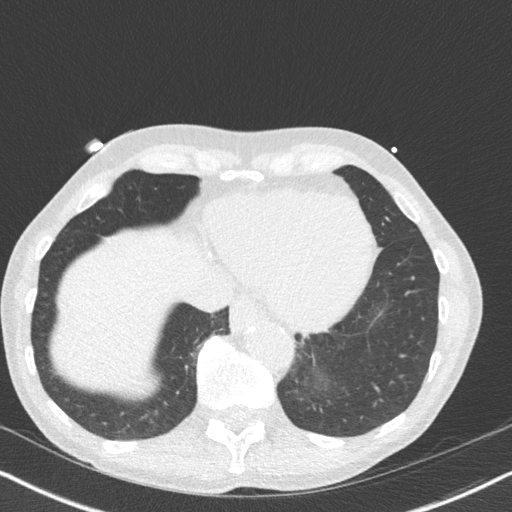
[im 17/49  lung]
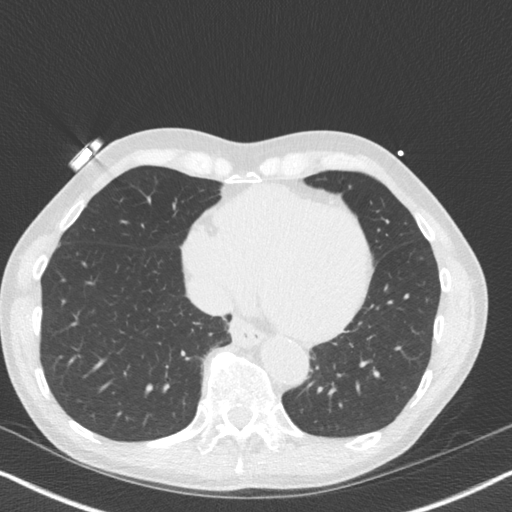
[im 22/49  lung]
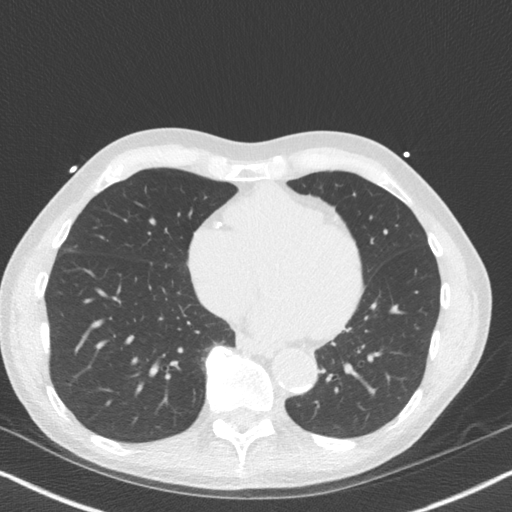
[im 27/49  lung]
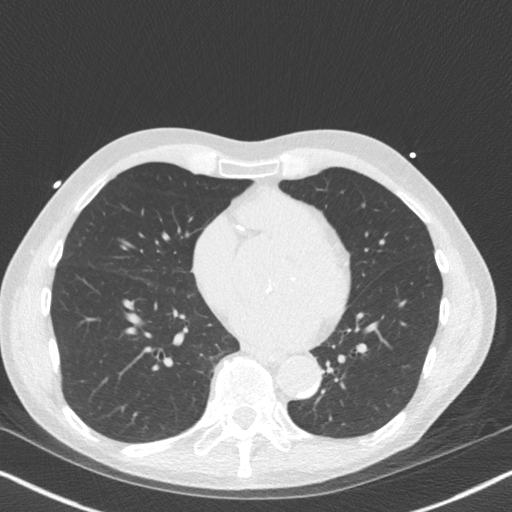
[im 33/49  lung]
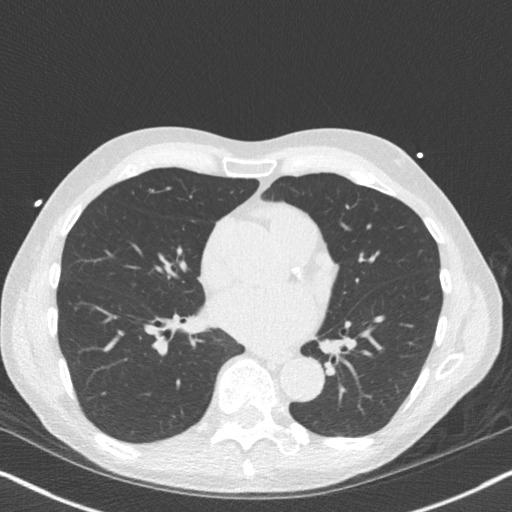
[im 38/49  lung]
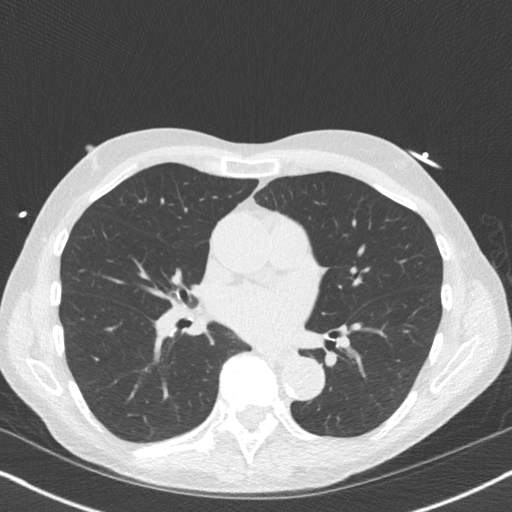
[im 43/49  lung]
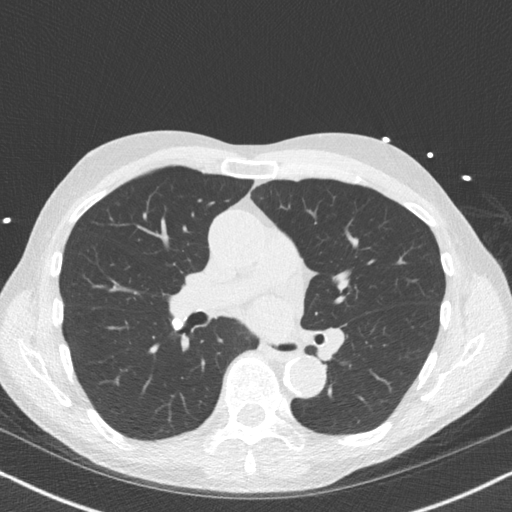

[16 of 20 positions shown; findings below may reference images not displayed]

FINDINGS: Vascular: The aortic root may be mildly dilated up to approximately
4.1-4.2 cm at the level of the sinuses of Valsalva. This is
difficult to accurately measure without contrast. The ascending
thoracic aorta is top-normal in caliber and measures approximately
3.8-3.9 cm.

Mediastinum/Nodes: Visualized mediastinum and hilar regions show no
evidence of lymphadenopathy or abnormal masses.

Lungs/Pleura: Visualized chest shows no evidence of pulmonary edema,
consolidation, pneumothorax, nodule or pleural fluid.

Upper Abdomen: No acute abnormality.

Musculoskeletal: No chest wall mass or suspicious bone lesions
identified.
IMPRESSION: There may be mild aneurysmal disease of the aortic root and
top-normal caliber of the ascending thoracic aorta. Measurements are
difficult to accurately make without contrast. Consider follow-up
evaluation with CTA or MRA of the chest with contrast.
FINDINGS: Non-cardiac: See separate report from [REDACTED].

Ascending Aorta: Upper normal limits of dimension at 3.8cm.

Pericardium: Normal

Coronary arteries: Normal coronary origins with coronary
calcifications in all 3 epicardial vessels.
IMPRESSION: Coronary calcium score of 907. This was 61% percentile for age and
sex matched control.

Recommend Chest CT angiogram to further assess dilated aorta at the
sinuses of Valsalva.

*** End of Addendum ***
EXAM:
OVER-READ INTERPRETATION  CT CHEST

The following report is an over-read performed by radiologist Dr.
Henny Melissa [REDACTED] on 05/31/2019. This
over-read does not include interpretation of cardiac or coronary
anatomy or pathology. The coronary calcium score interpretation by
the cardiologist is attached.
FINDINGS: Vascular: The aortic root may be mildly dilated up to approximately
4.1-4.2 cm at the level of the sinuses of Valsalva. This is
difficult to accurately measure without contrast. The ascending
thoracic aorta is top-normal in caliber and measures approximately
3.8-3.9 cm.

Mediastinum/Nodes: Visualized mediastinum and hilar regions show no
evidence of lymphadenopathy or abnormal masses.

Lungs/Pleura: Visualized chest shows no evidence of pulmonary edema,
consolidation, pneumothorax, nodule or pleural fluid.

Upper Abdomen: No acute abnormality.

Musculoskeletal: No chest wall mass or suspicious bone lesions
identified.
IMPRESSION: There may be mild aneurysmal disease of the aortic root and
top-normal caliber of the ascending thoracic aorta. Measurements are
difficult to accurately make without contrast. Consider follow-up
evaluation with CTA or MRA of the chest with contrast.

## 2021-01-27 ENCOUNTER — Encounter: Payer: Medicare Other | Admitting: Dermatology

## 2021-06-07 ENCOUNTER — Ambulatory Visit: Payer: Medicare Other | Admitting: Dermatology

## 2021-06-07 ENCOUNTER — Encounter: Payer: Self-pay | Admitting: Dermatology

## 2021-06-07 ENCOUNTER — Other Ambulatory Visit: Payer: Self-pay

## 2021-06-07 ENCOUNTER — Telehealth: Payer: Self-pay | Admitting: Cardiovascular Disease

## 2021-06-07 DIAGNOSIS — L814 Other melanin hyperpigmentation: Secondary | ICD-10-CM

## 2021-06-07 DIAGNOSIS — L820 Inflamed seborrheic keratosis: Secondary | ICD-10-CM

## 2021-06-07 DIAGNOSIS — D1800 Hemangioma unspecified site: Secondary | ICD-10-CM

## 2021-06-07 DIAGNOSIS — L821 Other seborrheic keratosis: Secondary | ICD-10-CM | POA: Diagnosis not present

## 2021-06-07 DIAGNOSIS — Z1283 Encounter for screening for malignant neoplasm of skin: Secondary | ICD-10-CM

## 2021-06-07 DIAGNOSIS — L570 Actinic keratosis: Secondary | ICD-10-CM | POA: Diagnosis not present

## 2021-06-07 DIAGNOSIS — L578 Other skin changes due to chronic exposure to nonionizing radiation: Secondary | ICD-10-CM

## 2021-06-07 DIAGNOSIS — D229 Melanocytic nevi, unspecified: Secondary | ICD-10-CM

## 2021-06-07 NOTE — Telephone Encounter (Signed)
Pt c/o BP issue: STAT if pt c/o blurred vision, one-sided weakness or slurred speech  1. What are your last 5 BP readings?  Range -149-180/77-89 HR 57-61  2. Are you having any other symptoms (ex. Dizziness, headache, blurred vision, passed out)? Dizziness when standing up  3. What is your BP issue? low

## 2021-06-07 NOTE — Patient Instructions (Addendum)

## 2021-06-07 NOTE — Telephone Encounter (Signed)
Spoke with patient and reviewed his concerns. He has had blood pressures in the ranges of 149-180/77-69 and heart rates ranging 57-61. He states the dizziness has been a long standing problem and does not feel that it worse. Rescheduled his follow up appointment to next Wednesday to evaluate his orthostatics and determine if there are any further changes needed. Instructed him to monitor blood pressures and keep log between now and next week in order to provide more data for appointment. He was agreeable with plan and had no further questions at this time.   Marland Kitchen

## 2021-06-07 NOTE — Progress Notes (Signed)
Follow-Up Visit   Subjective  Brett Ray is a 87 y.o. male who presents for the following: Annual Exam (Mole check ). Hx of Aks  The patient presents for Total-Body Skin Exam (TBSE) for skin cancer screening and mole check.   The following portions of the chart were reviewed this encounter and updated as appropriate:   Tobacco  Allergies  Meds  Problems  Med Hx  Surg Hx  Fam Hx     Review of Systems:  No other skin or systemic complaints except as noted in HPI or Assessment and Plan.  Objective  Well appearing patient in no apparent distress; mood and affect are within normal limits.  A full examination was performed including scalp, head, eyes, ears, nose, lips, neck, chest, axillae, abdomen, back, buttocks, bilateral upper extremities, bilateral lower extremities, hands, feet, fingers, toes, fingernails, and toenails. All findings within normal limits unless otherwise noted below.  left chest x 1, right upper eyelid margin x 1 (2) Erythematous keratotic or waxy stuck-on papule or plaque.   face x 4, scalp x 5 (9) Erythematous thin papules/macules with gritty scale.    Assessment & Plan  Inflamed seborrheic keratosis left chest x 1, right upper eyelid margin x 1  Destruction of lesion - left chest x 1, right upper eyelid margin x 1 Complexity: simple   Destruction method: cryotherapy   Informed consent: discussed and consent obtained   Timeout:  patient name, date of birth, surgical site, and procedure verified Lesion destroyed using liquid nitrogen: Yes   Region frozen until ice ball extended beyond lesion: Yes   Outcome: patient tolerated procedure well with no complications   Post-procedure details: wound care instructions given    AK (actinic keratosis) (9) face x 4, scalp x 5  Destruction of lesion - face x 4, scalp x 5 Complexity: simple   Destruction method: cryotherapy   Informed consent: discussed and consent obtained   Timeout:  patient name, date  of birth, surgical site, and procedure verified Lesion destroyed using liquid nitrogen: Yes   Region frozen until ice ball extended beyond lesion: Yes   Outcome: patient tolerated procedure well with no complications   Post-procedure details: wound care instructions given    Skin cancer screening  Lentigines - Scattered tan macules - Due to sun exposure - Benign-appering, observe - Recommend daily broad spectrum sunscreen SPF 30+ to sun-exposed areas, reapply every 2 hours as needed. - Call for any changes  Seborrheic Keratoses - Stuck-on, waxy, tan-brown papules and/or plaques  - Benign-appearing - Discussed benign etiology and prognosis. - Observe - Call for any changes  Melanocytic Nevi - Tan-brown and/or pink-flesh-colored symmetric macules and papules - Benign appearing on exam today - Observation - Call clinic for new or changing moles - Recommend daily use of broad spectrum spf 30+ sunscreen to sun-exposed areas.   Hemangiomas - Red papules - Discussed benign nature - Observe - Call for any changes  Actinic Damage - Chronic condition, secondary to cumulative UV/sun exposure - diffuse scaly erythematous macules with underlying dyspigmentation - Recommend daily broad spectrum sunscreen SPF 30+ to sun-exposed areas, reapply every 2 hours as needed.  - Staying in the shade or wearing long sleeves, sun glasses (UVA+UVB protection) and wide brim hats (4-inch brim around the entire circumference of the hat) are also recommended for sun protection.  - Call for new or changing lesions.  Skin cancer screening performed today.   Return in about 1 year (around 06/07/2022) for TBSE,  hx of Aks .  IMarye Round, CMA, am acting as scribe for Sarina Ser, MD .  Documentation: I have reviewed the above documentation for accuracy and completeness, and I agree with the above.  Sarina Ser, MD

## 2021-06-16 ENCOUNTER — Other Ambulatory Visit: Payer: Self-pay

## 2021-06-16 ENCOUNTER — Ambulatory Visit: Payer: Medicare Other | Admitting: Cardiovascular Disease

## 2021-06-16 ENCOUNTER — Encounter: Payer: Self-pay | Admitting: Cardiovascular Disease

## 2021-06-16 VITALS — BP 142/70 | HR 57 | Ht 69.0 in | Wt 161.2 lb

## 2021-06-16 DIAGNOSIS — R001 Bradycardia, unspecified: Secondary | ICD-10-CM | POA: Diagnosis not present

## 2021-06-16 DIAGNOSIS — I25118 Atherosclerotic heart disease of native coronary artery with other forms of angina pectoris: Secondary | ICD-10-CM

## 2021-06-16 DIAGNOSIS — I951 Orthostatic hypotension: Secondary | ICD-10-CM | POA: Diagnosis not present

## 2021-06-16 DIAGNOSIS — R55 Syncope and collapse: Secondary | ICD-10-CM | POA: Diagnosis not present

## 2021-06-16 DIAGNOSIS — E782 Mixed hyperlipidemia: Secondary | ICD-10-CM

## 2021-06-16 MED ORDER — HYDRALAZINE HCL 25 MG PO TABS: 25.0000 mg | ORAL_TABLET | Freq: Three times a day (TID) | ORAL | 3 refills | Status: DC | PRN

## 2021-06-16 MED ORDER — ROSUVASTATIN CALCIUM 20 MG PO TABS: 20.0000 mg | ORAL_TABLET | Freq: Every day | ORAL | 3 refills | Status: DC

## 2021-06-16 NOTE — Patient Instructions (Addendum)
Medication Instructions:  Please take hydralazine 25 mg daily as needed for blood pressure >170  If you need a refill on your cardiac medications before your next appointment, please call your pharmacy.   Lab work: No new labs needed  Testing/Procedures: No new testing needed  Follow-Up: At [facility], you and your health needs are our priority.  As part of our continuing mission to provide you with exceptional heart care, we have created designated Provider Care Teams.  These Care Teams include your primary Cardiologist (physician) and Advanced Practice Providers (APPs -  Physician Assistants and Nurse Practitioners) who all work together to provide you with the care you need, when you need it.  You will need a follow up appointment in 12 months  Providers on your designated Care Team:   Murray Hodgkins, NP Christell Faith, PA-C Marrianne Mood, PA-C Cadence Carbon, [place]  COVID-19 Vaccine Information can be found at: [email or web address].uk For questions related to vaccine distribution or appointments, please email [email or web address] or call [phone].

## 2021-06-16 NOTE — Progress Notes (Signed)
Date:  06/16/2021   ID:  Brett Ray, DOB 1931-03-21, MRN [id number]  Patient Location:  [place] [address]   Provider location:   [facility], [place] office  PCP:  Derinda Late, MD  Cardiologist:  Arvid Right [facility]  Chief Complaint  Patient presents with   12 month follow up     Patient c/o dizziness from sitting to standing. Medications reviewed by the patient verbally.     History of Present Illness:    Brett Ray is a 87 y.o. male  past medical history of Hypokalemia Depression Hypothyroid Hx of syncope after flomax, viagra, ETOH  09/2018 Chronic asymptomatic bradycardia Long hx of rare orthostasis Active exercise bike, everyday  calcium score 900 in 05/2019 Chronic dizziness Presents for f/u of his syncope, orthostasis, coronary calcification  Last seen in clinic August 2021 In follow-up today reports he is doing well  Minimal orthostasis, good ADLs Hydrating some, sometimes maybe not enough Followed by Dr. Tami Ribas, for chronic inner ear issues, working with PT Inner ear fine at this time he feels Dizziness unchanged from prior clinic visits No progrem getting up from chair If chair is low to the ground , when he stands up quickly will have symptoms  Orthostatics previously negative  Blood pressures reviewed from home, periodic spike in blood pressure  Total chol 230 to 160 on crestor 20 No side effects to the Crestor  CT coronary calcium scoreof 907.  This was 61% percentile for age and sex matched control.  Mildly dilated ascending aorta  EKG personally reviewed by myself on todays visit Shows normal sinus rhythm rate 57 bpm LVH right bundle branch block  Orthostatics done in the office today drop in blood pressure 169 down to 138 with standing, no significant change in heart rate  Other past medical history reviewed  hospital November 2019 for syncope Felt flush after drinking several glasses of  wine on empty stomach With sitting, tried to get up to go to the bathroom had acute syncope EMS called BP-91/56 initially,   Sat him up in a chair had syncope again He reported having Viagra 40 mg prior to the party, was also on Flomax Bradycardia rate 48 Reports having chronic bradycardia, typically is asymptomatic  In the hospital  No arrhythmias on telemetry.    Echocardiogram Left ventricle: The cavity size was mildly dilated. Wall   thickness was normal. Systolic function was normal. The estimated   ejection fraction was in the range of 55% to 60%. Wall motion was   normal; there were no regional wall motion abnormalities. Left   ventricular diastolic function parameters were normal. - Aortic valve: There was mild regurgitation. - Mitral valve: There was moderate regurgitation. - Right ventricle: The cavity size was mildly dilated. Wall   thickness was normal. - Right atrium: The atrium was mildly dilated. - Pulmonary arteries: Systolic pressure was mildly to moderately   increased. PA peak pressure: 45 mm Hg (S).   Previously walked around the office monitoring heart rate which increased from 48 bpm at rest up to 70 bpm, 1 lap around the office    Prior CV studies:   The following studies were reviewed today:   Past Medical History:  Diagnosis Date   Asymptomatic Sinus bradycardia    Hyperlipidemia    Hypothyroidism    Moderate mitral regurgitation    a. 09/2018 Echo: Mod MR.   Orthostasis    Syncope  a. 09/2018 syncope in the setting of wine/empty stomach/sildenafil/orthostasis; b. 09/2018 Echo: EF 55-60%, no rwma, mild AI, mod MR, mildly dil RV/RA. PASP 69mHg; c. 09/2018 Carotid U/S: <39% bilat ICA dzs.   Past Surgical History:  Procedure Laterality Date   APPENDECTOMY     SHOULDER ARTHROSCOPY       Current Meds  Medication Sig   calcium carbonate (TUMS - DOSED IN MG ELEMENTAL CALCIUM) 500 MG chewable tablet Chew 1 tablet by mouth 2 (two) times daily as  needed for indigestion or heartburn.   citalopram (CELEXA) 10 MG tablet Take 1 tablet by mouth daily.   finasteride (PROSCAR) 5 MG tablet Take 5 mg by mouth daily.   fluticasone (VERAMYST) 27.5 MCG/SPRAY nasal spray Place 2 sprays into the nose daily.   HYDROcodone-acetaminophen (NORCO/VICODIN) 5-325 MG tablet Norco 5 mg-325 mg tablet  1 PO q 6 hrs prn pain   latanoprost (XALATAN) 0.005 % ophthalmic solution 1 drop at bedtime.   levothyroxine (SYNTHROID, LEVOTHROID) 125 MCG tablet Take 125 mcg by mouth daily before breakfast.   meloxicam (MOBIC) 15 MG tablet Take 1 tablet by mouth daily.   polyethylene glycol (MIRALAX / GLYCOLAX) packet Take 17 g by mouth daily.   Probiotic Product (PROBIOTIC DAILY PO) Take by mouth daily.   rosuvastatin (CRESTOR) 20 MG tablet Take 1 tablet (20 mg total) by mouth at bedtime.   vitamin C (ASCORBIC ACID) 500 MG tablet Take 500 mg by mouth daily.     Allergies:   Patient has no known allergies.   Social History   Tobacco Use   Smoking status: Never   Smokeless tobacco: Never  Vaping Use   Vaping Use: Never used  Substance Use Topics   Alcohol use: Yes    Alcohol/week: 2.0 standard drinks    Types: 2 Glasses of wine per week    Comment: every evening   Drug use: Never     Family Hx: The patient's family history is not on file.  ROS:   Please see the history of present illness.    Review of Systems  Constitutional: Negative.   HENT: Negative.    Respiratory: Negative.    Cardiovascular: Negative.   Gastrointestinal: Negative.   Musculoskeletal: Negative.   Neurological: Negative.   Psychiatric/Behavioral: Negative.    All other systems reviewed and are negative.   Labs/Other Tests and Data Reviewed:    Recent Labs: No results found for requested labs within last 8760 hours.   Recent Lipid Panel No results found for: CHOL, TRIG, HDL, CHOLHDL, LDLCALC, LDLDIRECT   Exam:    Vital Signs: Vital signs may also be detailed in the  HPI BP (!) 142/70 (BP Location: Left Arm, Patient Position: Sitting, Cuff Size: Normal)   Pulse (!) 57   Ht '5\' 9"'$  (1.753 m)   Wt 161 lb 4 oz (73.1 kg)   SpO2 98%   BMI 23.81 kg/m   Constitutional:  oriented to person, place, and time. No distress.  HENT:  Head: Grossly normal Eyes:  no discharge. No scleral icterus.  Neck: No JVD, no carotid bruits  Cardiovascular: Regular rate and rhythm, no murmurs appreciated Pulmonary/Chest: Clear to auscultation bilaterally, no wheezes or rails Abdominal: Soft.  no distension.  no tenderness.  Musculoskeletal: Normal range of motion Neurological:  normal muscle tone. Coordination normal. No atrophy Skin: Skin warm and dry Psychiatric: normal affect, pleasant  ASSESSMENT & PLAN:    Coronary calcification on CT scan On Crestor, cholesterol at goal Denies  anginal symptoms  Chronic dizziness Relatively well-controlled symptoms, will avoid adding blood pressure medications or prostate medication Symptoms very stable, only having orthostasis symptoms if he gets up quickly from a chair low to the ground Most of his blood pressures at home typically [id number] systolic  Essential hypertension On average blood pressure systolic [id number], sometimes lower Typically will go lower with standing as demonstrated today with drop from 160 down to 130s Rare episodes where her blood pressure goes high and remains high for day On these days recommended he take hydralazine 25 mg as needed for systolic pressure over [id number]  Bradycardia -  Asymptomatic  Orthostasis - Relatively asymptomatic, we have recommended aggressive hydration Currently not on any medications  Syncope, unspecified syncope type -  No recent episodes, stay hydrated   Total encounter time more than 25 minutes  Greater than 50% was spent in counseling and coordination of care with the patient   Signed, Ida Rogue, MD  06/16/2021 9:45 AM    [place]  [id number], [place], [place] [address]

## 2021-06-28 ENCOUNTER — Ambulatory Visit: Payer: Medicare Other | Admitting: Cardiovascular Disease

## 2021-07-16 ENCOUNTER — Other Ambulatory Visit: Payer: Self-pay

## 2021-07-16 ENCOUNTER — Emergency Department: Payer: Medicare Other

## 2021-07-16 ENCOUNTER — Emergency Department
Admission: EM | Admit: 2021-07-16 | Discharge: 2021-07-16 | Disposition: A | Discharge status: Home / Self Care | Payer: Medicare Other | Attending: Emergency Medicine | Admitting: Emergency Medicine

## 2021-07-16 ENCOUNTER — Encounter: Payer: Self-pay | Admitting: Radiology

## 2021-07-16 DIAGNOSIS — E039 Hypothyroidism, unspecified: Secondary | ICD-10-CM | POA: Insufficient documentation

## 2021-07-16 DIAGNOSIS — Z7982 Long term (current) use of aspirin: Secondary | ICD-10-CM | POA: Diagnosis not present

## 2021-07-16 DIAGNOSIS — Z79899 Other long term (current) drug therapy: Secondary | ICD-10-CM | POA: Diagnosis not present

## 2021-07-16 DIAGNOSIS — R1033 Periumbilical pain: Secondary | ICD-10-CM | POA: Diagnosis present

## 2021-07-16 DIAGNOSIS — R630 Anorexia: Secondary | ICD-10-CM | POA: Diagnosis not present

## 2021-07-16 DIAGNOSIS — I10 Essential (primary) hypertension: Secondary | ICD-10-CM | POA: Diagnosis not present

## 2021-07-16 DIAGNOSIS — Z7952 Long term (current) use of systemic steroids: Secondary | ICD-10-CM | POA: Diagnosis not present

## 2021-07-16 DIAGNOSIS — R110 Nausea: Secondary | ICD-10-CM | POA: Insufficient documentation

## 2021-07-16 LAB — COMPREHENSIVE METABOLIC PANEL
ALT: 36 U/L (ref 0–44)
AST: 34 U/L (ref 15–41)
Albumin: 4.5 g/dL (ref 3.5–5.0)
Alkaline Phosphatase: 58 U/L (ref 38–126)
Anion gap: 9 (ref 5–15)
BUN: 14 mg/dL (ref 8–23)
CO2: 27 mmol/L (ref 22–32)
Calcium: 9.3 mg/dL (ref 8.9–10.3)
Chloride: 94 mmol/L — ABNORMAL LOW (ref 98–111)
Creatinine, Ser: 0.8 mg/dL (ref 0.61–1.24)
GFR, Estimated: >60 mL/min (ref >60)
Glucose, Bld: 198 mg/dL — ABNORMAL HIGH (ref 70–99)
Potassium: 4.2 mmol/L (ref 3.5–5.1)
Sodium: 130 mmol/L — ABNORMAL LOW (ref 135–145)
Total Bilirubin: 0.7 mg/dL (ref 0.3–1.2)
Total Protein: 6.9 g/dL (ref 6.5–8.1)

## 2021-07-16 LAB — CBC
HCT: 37.4 % — ABNORMAL LOW (ref 39.0–52.0)
Hemoglobin: 13.1 g/dL (ref 13.0–17.0)
MCH: 32.8 pg (ref 26.0–34.0)
MCHC: 35 g/dL (ref 30.0–36.0)
MCV: 93.7 fL (ref 80.0–100.0)
Platelets: 290 10*3/uL (ref 150–400)
RBC: 3.99 MIL/uL — ABNORMAL LOW (ref 4.22–5.81)
RDW: 14.1 % (ref 11.5–15.5)
WBC: 8.4 10*3/uL (ref 4.0–10.5)
nRBC: 0 % (ref 0.0–0.2)

## 2021-07-16 LAB — URINALYSIS, COMPLETE (UACMP) WITH MICROSCOPIC
Bacteria, UA: NONE SEEN
Bilirubin Urine: NEGATIVE
Glucose, UA: NEGATIVE mg/dL
Hgb urine dipstick: NEGATIVE
Ketones, ur: NEGATIVE mg/dL
Leukocytes,Ua: NEGATIVE
Nitrite: NEGATIVE
Protein, ur: NEGATIVE mg/dL
Specific Gravity, Urine: 1.012 (ref 1.005–1.030)
pH: 7 (ref 5.0–8.0)

## 2021-07-16 LAB — TROPONIN I (HIGH SENSITIVITY): Troponin I (High Sensitivity): 7 ng/L (ref <18)

## 2021-07-16 LAB — LIPASE, BLOOD: Lipase: 37 U/L (ref 11–51)

## 2021-07-16 MED ORDER — IOHEXOL 350 MG/ML SOLN
80.0000 mL | Freq: Once | INTRAVENOUS | Status: AC | PRN
  Administered 2021-07-16: 80 mL via INTRAVENOUS

## 2021-07-16 NOTE — ED Provider Notes (Signed)
[facility] Provider Note   ____________________________________________   Event Date/Time   First MD Initiated Contact with Patient 07/16/21 [phone]     (approximate)  I have reviewed the triage vital signs and the nursing notes.   HISTORY  Chief Complaint Abdominal Pain    HPI Brett Ray is a 87 y.o. male with past medical history of hypertension, hyperlipidemia, and hypothyroidism who presents to the ED complaining of abdominal pain.  Patient reports that he initially developed intermittent sharp pain in his periumbilical area 3 days ago.  Episodes of pain have been occurring daily since then, he has now been dealing with ongoing pain since about 10 PM last night.  Pain has been constant since then, is not exacerbated or alleviated by anything in particular.  He has been feeling nauseous with poor appetite, but denies any vomiting or diarrhea.  He has not been constipated and denies any blood in his stool.  He denies any fevers, dysuria, or flank pain.  He has not had any cough, chest pain, or shortness of breath.  He is status post appendectomy many years ago, denies any other abdominal surgeries.        Past Medical History:  Diagnosis Date   Asymptomatic Sinus bradycardia    Hyperlipidemia    Hypothyroidism    Moderate mitral regurgitation    a. 09/2018 Echo: Mod MR.   Orthostasis    Syncope    a. 09/2018 syncope in the setting of wine/empty stomach/sildenafil/orthostasis; b. 09/2018 Echo: EF 55-60%, no rwma, mild AI, mod MR, mildly dil RV/RA. PASP 42mHg; c. 09/2018 Carotid U/S: <39% bilat ICA dzs.    Patient Active Problem List   Diagnosis Date Noted   Orthostasis 10/01/2018   Bradycardia 10/01/2018   Depression 09/30/2018   Hypothyroid 09/30/2018   Syncope 09/22/2018    Past Surgical History:  Procedure Laterality Date   APPENDECTOMY     SHOULDER ARTHROSCOPY      Prior to Admission medications   Medication  Sig Start Date End Date Taking? Authorizing Provider  aspirin EC 81 MG tablet Take 81 mg by mouth daily. Patient not taking: Reported on 06/16/2021    [provider]  bimatoprost (LUMIGAN) 0.03 % ophthalmic solution Place 1 drop into both eyes at bedtime. Patient not taking: Reported on 06/16/2021    [provider]  bimatoprost (LUMIGAN) 0.03 % ophthalmic solution Place 1 drop into both eyes in the morning and at bedtime. Patient not taking: Reported on 06/16/2021    [provider]  calcium carbonate (TUMS - DOSED IN MG ELEMENTAL CALCIUM) 500 MG chewable tablet Chew 1 tablet by mouth 2 (two) times daily as needed for indigestion or heartburn.    [provider]  citalopram (CELEXA) 10 MG tablet Take 1 tablet by mouth daily. 08/28/18   [provider]  finasteride (PROSCAR) 5 MG tablet Take 5 mg by mouth daily.    [provider]  fluticasone (VERAMYST) 27.5 MCG/SPRAY nasal spray Place 2 sprays into the nose daily.    [provider]  hydrALAZINE (APRESOLINE) 25 MG tablet Take 1 tablet (25 mg total) by mouth 3 (three) times daily as needed (pressure >170). 06/16/21   Gollan, TKathlene November MD  HYDROcodone-acetaminophen (NORCO/VICODIN) 5-325 MG tablet Norco 5 mg-325 mg tablet  1 PO q 6 hrs prn pain    [provider]  latanoprost (XALATAN) 0.005 % ophthalmic solution 1 drop at bedtime. 05/19/21   [provider]  levothyroxine (SYNTHROID, LEVOTHROID) 125 MCG tablet Take 125 mcg by mouth daily before breakfast.    [provider]  LUMIGAN 0.01 % SOLN INT 1 GTT IN OU QPM Patient not taking: Reported on 06/16/2021 07/27/19   [provider]  meloxicam (MOBIC) 15 MG tablet Take 1 tablet by mouth daily. 07/18/18   [provider]  polyethylene glycol (MIRALAX / GLYCOLAX) packet Take 17 g by mouth daily.    [provider]  Probiotic Product (PROBIOTIC DAILY PO) Take by mouth daily.    [provider]  rosuvastatin (CRESTOR) 20 MG tablet Take 1 tablet (20 mg total) by mouth at bedtime. 06/16/21   Minna Merritts, MD  vitamin C (ASCORBIC ACID) 500 MG tablet Take 500 mg by mouth daily.    [provider]    Allergies Patient has no known allergies.  No family history on file.  Social History Social History   Tobacco Use   Smoking status: Never   Smokeless tobacco: Never  Vaping Use   Vaping Use: Never used  Substance Use Topics   Alcohol use: Yes    Alcohol/week: 2.0 standard drinks    Types: 2 Glasses of wine per week    Comment: every evening   Drug use: Never    Review of Systems  Constitutional: No fever/chills Eyes: No visual changes. ENT: No sore throat. Cardiovascular: Denies chest pain. Respiratory: Denies shortness of breath. Gastrointestinal: Positive for abdominal pain and nausea, no vomiting.  No diarrhea.  No constipation. Genitourinary: Negative for dysuria. Musculoskeletal: Negative for back pain. Skin: Negative for rash. Neurological: Negative for headaches, focal weakness or numbness.  ____________________________________________   PHYSICAL EXAM:  VITAL SIGNS: ED Triage Vitals  Enc Vitals Group     BP 07/16/21 0518 (!) 143/70     Pulse Rate 07/16/21 0518 75     Resp 07/16/21 0518 20     Temp 07/16/21 0518 98.2 F (36.8 C)     Temp Source 07/16/21 0518 Oral     SpO2 07/16/21 0518 98 %     Weight 07/16/21 0519 156 lb (70.8 kg)     Height 07/16/21 0519 '5\' 9"'$  (1.753 m)     Head Circumference --      Peak Flow --      Pain Score 07/16/21 0518 1     Pain Loc --      Pain Edu? --      Excl. in [place]? --     Constitutional: Alert and oriented. Eyes: Conjunctivae are normal. Head: Atraumatic. Nose: No congestion/rhinnorhea. Mouth/Throat: Mucous membranes are moist. Neck: Normal ROM Cardiovascular: Normal rate, regular rhythm. Grossly normal heart sounds.  2+ radial pulses bilaterally. Respiratory: Normal respiratory  effort.  No retractions. Lungs CTAB. Gastrointestinal: Soft and mildly tender to palpation diffusely with no rebound or guarding.  No distention. Genitourinary: deferred Musculoskeletal: No lower extremity tenderness nor edema. Neurologic:  Normal speech and language. No gross focal neurologic deficits are appreciated. Skin:  Skin is warm, dry and intact. No rash noted. Psychiatric: Mood and affect are normal. Speech and behavior are normal.  ____________________________________________   LABS (all labs ordered are listed, but only abnormal results are displayed)  Labs Reviewed  CBC - Abnormal; Notable for the following components:      Result Value   RBC 3.99 (*)    HCT 37.4 (*)    All other components within normal limits  COMPREHENSIVE METABOLIC PANEL - Abnormal; Notable for the following components:  Sodium 130 (*)    Chloride 94 (*)    Glucose, Bld 198 (*)    All other components within normal limits  URINALYSIS, COMPLETE (UACMP) WITH MICROSCOPIC - Abnormal; Notable for the following components:   Color, Urine YELLOW (*)    APPearance CLEAR (*)    All other components within normal limits  LIPASE, BLOOD  TROPONIN I (HIGH SENSITIVITY)   ____________________________________________  EKG  ED ECG REPORT I, Blake Divine, the attending physician, personally viewed and interpreted this ECG.   Date: 07/16/2021  EKG Time: 8:21  Rate: 56  Rhythm: sinus bradycardia  Axis: Normal  Intervals:right bundle branch block and left posterior fascicular block  ST&T Change: Lateral T wave inversions, similar to previous   PROCEDURES  Procedure(s) performed (including Critical Care):  Procedures   ____________________________________________   INITIAL IMPRESSION / ASSESSMENT AND PLAN / ED COURSE      87 year old male with past medical history of hypertension, hyperlipidemia, and hypothyroidism who presents to the ED complaining of intermittent periumbilical pain for the  past 3 days that has become constant since last night.  Patient with diffuse tenderness on exam, labs thus far are reassuring but we will further assess with CT scan.  UA shows no signs of infection.  We will add on EKG and troponin to screen for cardiac cause, but overall low suspicion for ACS.  Patient declines pain or nausea medication at this time.  CT scan is negative for acute process, patient reports pain is minimal on reassessment.  No apparent emergent etiology for patient's abdominal pain and he is appropriate for discharge home with PCP follow-up.  His blood pressure has been high here in the ED, which patient states is not unusual for him.  He states his cardiologist and PCP has been adjusting his blood pressure medications and he was counseled to follow-up with them.  There is no evidence of hypertensive emergency at this time.  He was counseled to return to the ED for new worsening symptoms, patient agrees with plan.      ____________________________________________   FINAL CLINICAL IMPRESSION(S) / ED DIAGNOSES  Final diagnoses:  Periumbilical abdominal pain  Hypertension, unspecified type     ED Discharge Orders     None        Note:  This document was prepared using Dragon voice recognition software and may include unintentional dictation errors.    Blake Divine, MD 07/16/21 [phone]

## 2021-07-16 NOTE — ED Triage Notes (Signed)
Pt in with co mid abd pain x 3 days. Denies any n.v.d. no fever. Pt denies any hx of the same states has reflux but symptoms are different.

## 2021-08-15 ENCOUNTER — Other Ambulatory Visit: Payer: Self-pay | Admitting: Cardiovascular Disease

## 2022-03-13 ENCOUNTER — Other Ambulatory Visit: Payer: Self-pay | Admitting: Cardiovascular Disease

## 2022-04-08 DIAGNOSIS — M5136 Other intervertebral disc degeneration, lumbar region: Secondary | ICD-10-CM | POA: Insufficient documentation

## 2022-04-08 DIAGNOSIS — M65342 Trigger finger, left ring finger: Secondary | ICD-10-CM | POA: Insufficient documentation

## 2022-04-27 ENCOUNTER — Other Ambulatory Visit: Payer: Self-pay | Admitting: Cardiovascular Disease

## 2022-05-17 ENCOUNTER — Emergency Department: Payer: Medicare Other

## 2022-05-17 ENCOUNTER — Other Ambulatory Visit: Payer: Self-pay

## 2022-05-17 ENCOUNTER — Inpatient Hospital Stay
Admission: EM | Admit: 2022-05-17 | Discharge: 2022-05-22 | DRG: 357 | Disposition: A | Discharge status: Skilled Nursing Facility | Payer: Medicare Other | Source: Skilled Nursing Facility | Attending: Internal Medicine | Admitting: Internal Medicine

## 2022-05-17 ENCOUNTER — Encounter: Payer: Self-pay | Admitting: Emergency Medicine

## 2022-05-17 DIAGNOSIS — R197 Diarrhea, unspecified: Secondary | ICD-10-CM | POA: Diagnosis present

## 2022-05-17 DIAGNOSIS — I4510 Unspecified right bundle-branch block: Secondary | ICD-10-CM | POA: Diagnosis present

## 2022-05-17 DIAGNOSIS — Z79899 Other long term (current) drug therapy: Secondary | ICD-10-CM | POA: Diagnosis not present

## 2022-05-17 DIAGNOSIS — K2100 Gastro-esophageal reflux disease with esophagitis, without bleeding: Secondary | ICD-10-CM | POA: Diagnosis present

## 2022-05-17 DIAGNOSIS — E538 Deficiency of other specified B group vitamins: Secondary | ICD-10-CM | POA: Diagnosis present

## 2022-05-17 DIAGNOSIS — E039 Hypothyroidism, unspecified: Secondary | ICD-10-CM | POA: Diagnosis present

## 2022-05-17 DIAGNOSIS — K284 Chronic or unspecified gastrojejunal ulcer with hemorrhage: Secondary | ICD-10-CM | POA: Diagnosis not present

## 2022-05-17 DIAGNOSIS — Z79890 Hormone replacement therapy (postmenopausal): Secondary | ICD-10-CM

## 2022-05-17 DIAGNOSIS — E876 Hypokalemia: Secondary | ICD-10-CM | POA: Diagnosis not present

## 2022-05-17 DIAGNOSIS — I340 Nonrheumatic mitral (valve) insufficiency: Secondary | ICD-10-CM | POA: Diagnosis present

## 2022-05-17 DIAGNOSIS — K922 Gastrointestinal hemorrhage, unspecified: Secondary | ICD-10-CM | POA: Diagnosis not present

## 2022-05-17 DIAGNOSIS — K921 Melena: Secondary | ICD-10-CM | POA: Diagnosis present

## 2022-05-17 DIAGNOSIS — D649 Anemia, unspecified: Secondary | ICD-10-CM | POA: Diagnosis not present

## 2022-05-17 DIAGNOSIS — N400 Enlarged prostate without lower urinary tract symptoms: Secondary | ICD-10-CM | POA: Diagnosis present

## 2022-05-17 DIAGNOSIS — D62 Acute posthemorrhagic anemia: Secondary | ICD-10-CM | POA: Diagnosis present

## 2022-05-17 DIAGNOSIS — Z791 Long term (current) use of non-steroidal anti-inflammatories (NSAID): Secondary | ICD-10-CM | POA: Diagnosis not present

## 2022-05-17 DIAGNOSIS — E785 Hyperlipidemia, unspecified: Secondary | ICD-10-CM | POA: Diagnosis present

## 2022-05-17 DIAGNOSIS — Z7982 Long term (current) use of aspirin: Secondary | ICD-10-CM | POA: Diagnosis not present

## 2022-05-17 DIAGNOSIS — K449 Diaphragmatic hernia without obstruction or gangrene: Secondary | ICD-10-CM | POA: Diagnosis present

## 2022-05-17 DIAGNOSIS — I10 Essential (primary) hypertension: Secondary | ICD-10-CM | POA: Diagnosis present

## 2022-05-17 DIAGNOSIS — F32A Depression, unspecified: Secondary | ICD-10-CM | POA: Diagnosis present

## 2022-05-17 LAB — CBC WITH DIFFERENTIAL/PLATELET
Abs Immature Granulocytes: 0.04 10*3/uL (ref 0.00–0.07)
Basophils Absolute: 0 10*3/uL (ref 0.0–0.1)
Basophils Relative: 0 %
Eosinophils Absolute: 0.1 10*3/uL (ref 0.0–0.5)
Eosinophils Relative: 1 %
HCT: 22.7 % — ABNORMAL LOW (ref 39.0–52.0)
Hemoglobin: 7.6 g/dL — ABNORMAL LOW (ref 13.0–17.0)
Immature Granulocytes: 1 %
Lymphocytes Relative: 14 %
Lymphs Abs: 1.3 10*3/uL (ref 0.7–4.0)
MCH: 31.1 pg (ref 26.0–34.0)
MCHC: 33.5 g/dL (ref 30.0–36.0)
MCV: 93 fL (ref 80.0–100.0)
Monocytes Absolute: 0.6 10*3/uL (ref 0.1–1.0)
Monocytes Relative: 7 %
Neutro Abs: 6.9 10*3/uL (ref 1.7–7.7)
Neutrophils Relative %: 77 %
Platelets: 199 10*3/uL (ref 150–400)
RBC: 2.44 MIL/uL — ABNORMAL LOW (ref 4.22–5.81)
RDW: 14.1 % (ref 11.5–15.5)
WBC: 8.9 10*3/uL (ref 4.0–10.5)
nRBC: 0 % (ref 0.0–0.2)

## 2022-05-17 LAB — CBC
HCT: 20.2 % — ABNORMAL LOW (ref 39.0–52.0)
HCT: 17.6 % — ABNORMAL LOW (ref 39.0–52.0)
Hemoglobin: 6.9 g/dL — ABNORMAL LOW (ref 13.0–17.0)
Hemoglobin: 6 g/dL — ABNORMAL LOW (ref 13.0–17.0)
MCH: 32.1 pg (ref 26.0–34.0)
MCH: 31.9 pg (ref 26.0–34.0)
MCHC: 34.2 g/dL (ref 30.0–36.0)
MCHC: 34.1 g/dL (ref 30.0–36.0)
MCV: 94 fL (ref 80.0–100.0)
MCV: 93.6 fL (ref 80.0–100.0)
Platelets: 191 10*3/uL (ref 150–400)
Platelets: 164 10*3/uL (ref 150–400)
RBC: 2.15 MIL/uL — ABNORMAL LOW (ref 4.22–5.81)
RBC: 1.88 MIL/uL — ABNORMAL LOW (ref 4.22–5.81)
RDW: 14.1 % (ref 11.5–15.5)
RDW: 14.5 % (ref 11.5–15.5)
WBC: 8.2 10*3/uL (ref 4.0–10.5)
WBC: 6.9 10*3/uL (ref 4.0–10.5)
nRBC: 0 % (ref 0.0–0.2)
nRBC: 0 % (ref 0.0–0.2)

## 2022-05-17 LAB — COMPREHENSIVE METABOLIC PANEL
ALT: 16 U/L (ref 0–44)
AST: 21 U/L (ref 15–41)
Albumin: 3.6 g/dL (ref 3.5–5.0)
Alkaline Phosphatase: 29 U/L — ABNORMAL LOW (ref 38–126)
Anion gap: 8 (ref 5–15)
BUN: 25 mg/dL — ABNORMAL HIGH (ref 8–23)
CO2: 22 mmol/L (ref 22–32)
Calcium: 8.4 mg/dL — ABNORMAL LOW (ref 8.9–10.3)
Chloride: 101 mmol/L (ref 98–111)
Creatinine, Ser: 0.83 mg/dL (ref 0.61–1.24)
GFR, Estimated: >60 mL/min (ref >60)
Glucose, Bld: 154 mg/dL — ABNORMAL HIGH (ref 70–99)
Potassium: 4.1 mmol/L (ref 3.5–5.1)
Sodium: 131 mmol/L — ABNORMAL LOW (ref 135–145)
Total Bilirubin: 0.7 mg/dL (ref 0.3–1.2)
Total Protein: 5.4 g/dL — ABNORMAL LOW (ref 6.5–8.1)

## 2022-05-17 LAB — PROTIME-INR
INR: 1.2 (ref 0.8–1.2)
Prothrombin Time: 14.6 seconds (ref 11.4–15.2)

## 2022-05-17 LAB — RETICULOCYTES
Immature Retic Fract: 18.3 % — ABNORMAL HIGH (ref 2.3–15.9)
RBC.: 2.27 MIL/uL — ABNORMAL LOW (ref 4.22–5.81)
Retic Count, Absolute: 44.9 10*3/uL (ref 19.0–186.0)
Retic Ct Pct: 2 % (ref 0.4–3.1)

## 2022-05-17 LAB — ABO/RH: ABO/RH(D): AB POS

## 2022-05-17 LAB — IRON AND TIBC
Iron: 56 ug/dL (ref 45–182)
Saturation Ratios: 16 % — ABNORMAL LOW (ref 17.9–39.5)
TIBC: 349 ug/dL (ref 250–450)
UIBC: 293 ug/dL

## 2022-05-17 LAB — FERRITIN: Ferritin: 21 ng/mL — ABNORMAL LOW (ref 24–336)

## 2022-05-17 LAB — LIPASE, BLOOD: Lipase: 45 U/L (ref 11–51)

## 2022-05-17 LAB — FOLATE: Folate: 8.6 ng/mL (ref >5.9)

## 2022-05-17 LAB — APTT: aPTT: 27 seconds (ref 24–36)

## 2022-05-17 MED ORDER — SODIUM CHLORIDE 0.9 % IV SOLN
510.0000 mg | Freq: Once | INTRAVENOUS | Status: AC
  Administered 2022-05-17: 510 mg via INTRAVENOUS
  Filled 2022-05-17: qty 17

## 2022-05-17 MED ORDER — ACETAMINOPHEN 325 MG PO TABS: 650.0000 mg | ORAL_TABLET | Freq: Four times a day (QID) | ORAL | Status: DC | PRN

## 2022-05-17 MED ORDER — PANTOPRAZOLE SODIUM 40 MG IV SOLR
40.0000 mg | Freq: Once | INTRAVENOUS | Status: AC
  Administered 2022-05-17: 40 mg via INTRAVENOUS
  Filled 2022-05-17: qty 10

## 2022-05-17 MED ORDER — FERROUS SULFATE 325 (65 FE) MG PO TABS
325.0000 mg | ORAL_TABLET | Freq: Every day | ORAL | Status: DC
  Administered 2022-05-18 – 2022-05-22 (×4): 325 mg via ORAL
  Filled 2022-05-17 (×4): qty 1

## 2022-05-17 MED ORDER — HYDRALAZINE HCL 50 MG PO TABS: 25.0000 mg | ORAL_TABLET | Freq: Three times a day (TID) | ORAL | Status: DC | PRN

## 2022-05-17 MED ORDER — LEVOTHYROXINE SODIUM 137 MCG PO TABS
137.0000 ug | ORAL_TABLET | Freq: Every day | ORAL | Status: DC
  Administered 2022-05-18 – 2022-05-22 (×3): 137 ug via ORAL
  Filled 2022-05-17 (×5): qty 1

## 2022-05-17 MED ORDER — RISAQUAD PO CAPS
1.0000 | ORAL_CAPSULE | Freq: Every day | ORAL | Status: DC
  Administered 2022-05-18 – 2022-05-22 (×4): 1 via ORAL
  Filled 2022-05-17 (×5): qty 1

## 2022-05-17 MED ORDER — CALCIUM CARBONATE ANTACID 500 MG PO CHEW
1.0000 | CHEWABLE_TABLET | Freq: Two times a day (BID) | ORAL | Status: DC | PRN
  Administered 2022-05-17: 200 mg via ORAL
  Filled 2022-05-17: qty 1

## 2022-05-17 MED ORDER — PANTOPRAZOLE SODIUM 40 MG IV SOLR
40.0000 mg | Freq: Two times a day (BID) | INTRAVENOUS | Status: DC
  Administered 2022-05-17 – 2022-05-20 (×6): 40 mg via INTRAVENOUS
  Filled 2022-05-17 (×6): qty 10

## 2022-05-17 MED ORDER — SODIUM CHLORIDE 0.9% IV SOLUTION: Freq: Once | INTRAVENOUS | Status: AC

## 2022-05-17 MED ORDER — IOHEXOL 350 MG/ML SOLN
80.0000 mL | Freq: Once | INTRAVENOUS | Status: AC | PRN
  Administered 2022-05-17: 80 mL via INTRAVENOUS

## 2022-05-17 MED ORDER — METHOCARBAMOL 500 MG PO TABS
500.0000 mg | ORAL_TABLET | Freq: Two times a day (BID) | ORAL | Status: DC
  Administered 2022-05-17 – 2022-05-21 (×5): 500 mg via ORAL
  Filled 2022-05-17 (×12): qty 1

## 2022-05-17 MED ORDER — ROSUVASTATIN CALCIUM 20 MG PO TABS
20.0000 mg | ORAL_TABLET | Freq: Every day | ORAL | Status: DC
  Administered 2022-05-17 – 2022-05-22 (×5): 20 mg via ORAL
  Filled 2022-05-17 (×5): qty 1

## 2022-05-17 MED ORDER — ONDANSETRON HCL 4 MG/2ML IJ SOLN: 4.0000 mg | Freq: Three times a day (TID) | INTRAMUSCULAR | Status: DC | PRN

## 2022-05-17 MED ORDER — FLUTICASONE PROPIONATE 50 MCG/ACT NA SUSP: 2.0000 | Freq: Every day | NASAL | Status: DC | PRN

## 2022-05-17 MED ORDER — FINASTERIDE 5 MG PO TABS
5.0000 mg | ORAL_TABLET | Freq: Every day | ORAL | Status: DC
  Administered 2022-05-17 – 2022-05-22 (×5): 5 mg via ORAL
  Filled 2022-05-17 (×6): qty 1

## 2022-05-17 MED ORDER — CITALOPRAM HYDROBROMIDE 20 MG PO TABS
10.0000 mg | ORAL_TABLET | Freq: Every day | ORAL | Status: DC
  Administered 2022-05-17 – 2022-05-22 (×5): 10 mg via ORAL
  Filled 2022-05-17 (×5): qty 1

## 2022-05-17 MED ORDER — LACTATED RINGERS IV BOLUS
1000.0000 mL | Freq: Once | INTRAVENOUS | Status: AC
  Administered 2022-05-17: 1000 mL via INTRAVENOUS

## 2022-05-17 MED ORDER — ASCORBIC ACID 500 MG PO TABS
500.0000 mg | ORAL_TABLET | Freq: Every day | ORAL | Status: DC
  Administered 2022-05-17 – 2022-05-22 (×5): 500 mg via ORAL
  Filled 2022-05-17 (×5): qty 1

## 2022-05-17 MED ORDER — LATANOPROST 0.005 % OP SOLN
1.0000 [drp] | Freq: Every day | OPHTHALMIC | Status: DC
Start: 2022-05-17 — End: 2022-05-22
  Administered 2022-05-17 – 2022-05-19 (×2): 1 [drp] via OPHTHALMIC
  Filled 2022-05-17: qty 2.5

## 2022-05-17 MED ORDER — SODIUM CHLORIDE 0.9 % IV SOLN: INTRAVENOUS | Status: DC

## 2022-05-17 NOTE — Assessment & Plan Note (Signed)
Proscar 

## 2022-05-17 NOTE — Assessment & Plan Note (Addendum)
GI bleeding and acute blood loss anemia: Hemoglobin dropped from 12.1-7.6.  Consulted Dr. Vicente Males of GI  - IVF: 1L LR bolus, then at 75 mL/hr of NS - Start IV pantoprazole 40 bid - Zofran IV for nausea - Avoid NSAIDs and SQ heparin - Maintain IV access (2 large bore IVs if possible). - Monitor closely and follow q6h cbc, transfuse as necessary, if Hgb<7.0 - LaB: INR, PTT and type screen, anemia panel -Hold aspirin and Mobic  Addendum: anemia panel showed iron deficiency anemia with ferritin 21,  Transferrin saturation (TSAT) is 16%. -will start iron supplement, ferrous sulfate 325 mg daily -Patient will need as needed laxative with diarrhea is resolved -Give 1 dose of IV Feraheme 510 mg  Addendum: hemoglobin dropped to 6.9 -Transfuse 2 unit of blood

## 2022-05-17 NOTE — Assessment & Plan Note (Addendum)
Synthroid 

## 2022-05-17 NOTE — Assessment & Plan Note (Signed)
-   Continue home Celexa

## 2022-05-17 NOTE — Assessment & Plan Note (Signed)
-  see above 

## 2022-05-17 NOTE — H&P (Addendum)
History and Physical    Brett Ray YQM:[id number] DOB: [birth year] DOA: 05/17/2022  Referring MD/NP/PA:   PCP: Brett Late, MD   Patient coming from:  The patient is coming from home.  At baseline, pt is independent for most of ADL.        Chief Complaint: diarrhea  HPI: Brett Ray is a 88 y.o. male with medical history significant of hypertension, hyperlipidemia, hypothyroidism, depression, syncope, orthostatic status, mitral valve regurgitation, sinus bradycardia, BPH, who presents with diarrhea.  Patient states that he has had 2 episodes of large amounts of watery diarrhea since yesterday, with dark brown stool.  Denies nausea, vomiting, diarrhea or abdominal pain.  Patient has generalized weakness.  No fever or chills.  Patient does not have chest pain, cough, shortness breath.  No symptoms of UTI. Pt did not notice rectal bleeding, but states that his stool is dark brown in color.  EDP did rectal exam and confirmed positive GI bleeding.  Data reviewed independently and ED Course: pt was found to have WBC 8.9, GFR> 60, hemoglobin dropped from a 12.1 on 03/04/2022 to 7.6 today. Per EDP, rectal exam confirms GI bleeding.  Temperature 97.5, blood pressure 157/72, heart rate 74, RR 16, oxygen saturation 100% on room air.  CT angiogram of abdomen/pelvis is negative for active bleeding, but showed inflammatory changes in colon, diverticulosis without evidence of diverticulitis.  Patient is admitted to [facility] bed as inpatient.  GI, Dr. Vicente Ray Is consulted.   CTA of abdomen/pelvis: 1. No active hemorrhage is identified within the large or small bowel. 2. Mild thickening of the walls of the ascending colon, with subtle pericolonic inflammation/fluid stranding, suspicious for a mild colitis of infectious or inflammatory nature. 3. Colonic diverticulosis without evidence of acute diverticulitis.   Aortic Atherosclerosis (ICD10-I70.0).     EKG: I have personally reviewed.  Sinus rhythm,  QTc 475, right bundle blockade, early R wave progression   Review of Systems:   General: no fevers, chills, no body weight gain, has fatigue HEENT: no blurry vision, hearing changes or sore throat Respiratory: no dyspnea, coughing, wheezing CV: no chest pain, no palpitations GI: no nausea, vomiting, abdominal pain, has diarrhea, no constipation GU: no dysuria, burning on urination, increased urinary frequency, hematuria  Ext: no leg edema Neuro: no unilateral weakness, numbness, or tingling, no vision change or hearing loss Skin: no rash, no skin tear. MSK: No muscle spasm, no deformity, no limitation of range of movement in spin Heme: No easy bruising.  Travel history: No recent long distant travel.   Allergy: No Known Allergies  Past Medical History:  Diagnosis Date   Asymptomatic Sinus bradycardia    Hyperlipidemia    Hypothyroidism    Moderate mitral regurgitation    a. 09/2018 Echo: Mod MR.   Orthostasis    Syncope    a. 09/2018 syncope in the setting of wine/empty stomach/sildenafil/orthostasis; b. 09/2018 Echo: EF 55-60%, no rwma, mild AI, mod MR, mildly dil RV/RA. PASP 31mHg; c. 09/2018 Carotid U/S: <39% bilat ICA dzs.    Past Surgical History:  Procedure Laterality Date   APPENDECTOMY     SHOULDER ARTHROSCOPY      Social History:  reports that he has never smoked. He has never used smokeless tobacco. He reports current alcohol use of about 2.0 standard drinks of alcohol per week. He reports that he does not use drugs.  Family History: History reviewed. No pertinent family history.   Prior to Admission medications   Medication Sig Start Date  End Date Taking? Authorizing Provider  aspirin EC 81 MG tablet Take 81 mg by mouth daily. Patient not taking: Reported on 06/16/2021    [provider]  bimatoprost (LUMIGAN) 0.03 % ophthalmic solution Place 1 drop into both eyes at bedtime. Patient not taking: Reported on 06/16/2021    [provider]   bimatoprost (LUMIGAN) 0.03 % ophthalmic solution Place 1 drop into both eyes in the morning and at bedtime. Patient not taking: Reported on 06/16/2021    [provider]  calcium carbonate (TUMS - DOSED IN MG ELEMENTAL CALCIUM) 500 MG chewable tablet Chew 1 tablet by mouth 2 (two) times daily as needed for indigestion or heartburn.    [provider]  citalopram (CELEXA) 10 MG tablet Take 1 tablet by mouth daily. 08/28/18   [provider]  finasteride (PROSCAR) 5 MG tablet Take 5 mg by mouth daily.    [provider]  fluticasone (VERAMYST) 27.5 MCG/SPRAY nasal spray Place 2 sprays into the nose daily.    [provider]  hydrALAZINE (APRESOLINE) 25 MG tablet TAKE 1 TABLET BY MOUTH THREE TIMES DAILY AS NEEDED( PRESSURE> 170) 03/14/22   Gollan, Brett November, MD  HYDROcodone-acetaminophen (NORCO/VICODIN) 5-325 MG tablet Norco 5 mg-325 mg tablet  1 PO q 6 hrs prn pain    [provider]  latanoprost (XALATAN) 0.005 % ophthalmic solution 1 drop at bedtime. 05/19/21   [provider]  levothyroxine (SYNTHROID, LEVOTHROID) 125 MCG tablet Take 125 mcg by mouth daily before breakfast.    [provider]  LUMIGAN 0.01 % SOLN INT 1 GTT IN OU QPM Patient not taking: Reported on 06/16/2021 07/27/19   [provider]  meloxicam (MOBIC) 15 MG tablet Take 1 tablet by mouth daily. 07/18/18   [provider]  polyethylene glycol (MIRALAX / GLYCOLAX) packet Take 17 g by mouth daily.    [provider]  Probiotic Product (PROBIOTIC DAILY PO) Take by mouth daily.    [provider]  rosuvastatin (CRESTOR) 20 MG tablet TAKE 1 TABLET(20 MG) BY MOUTH AT BEDTIME 04/27/22   Brett Merritts, MD  vitamin C (ASCORBIC ACID) 500 MG tablet Take 500 mg by mouth daily.    [provider]    Physical Exam: Vitals:   05/17/22 0819 05/17/22 1146 05/17/22 1300 05/17/22 1607  BP: (!) 157/72  (!) 145/70 (!) 151/70  Pulse: 65   68 70  Resp: '16  18 18  '$ Temp:  (!) 97.5 F (36.4 C) 98.2 F (36.8 C) 98 F (36.7 C)  TempSrc:  Oral Oral Oral  SpO2: 98%  100% 98%  Weight:      Height:       General: Not in acute distress.  Dry mucosal membrane HEENT:       Eyes: PERRL, EOMI, no scleral icterus.       ENT: No discharge from the ears and nose, no pharynx injection, no tonsillar enlargement.        Neck: No JVD, no bruit, no mass felt. Heme: No neck lymph node enlargement. Cardiac: S1/S2, RRR, No gallops or rubs. Respiratory: No rales, wheezing, rhonchi or rubs. GI: Soft, nondistended, nontender, no rebound pain, no organomegaly, BS present. GU: No hematuria Ext: No pitting leg edema bilaterally. 1+DP/PT pulse bilaterally. Musculoskeletal: No joint deformities, No joint redness or warmth, no limitation of ROM in spin. Skin: No rashes.  Neuro: Alert, oriented X3, cranial nerves II-XII grossly intact, moves all extremities normally. Psych: Patient is not  psychotic, no suicidal or hemocidal ideation.  Labs on Admission: I have personally reviewed following labs and imaging studies  CBC: Recent Labs  Lab 05/17/22 0649 05/17/22 1639  WBC 8.9 8.2  NEUTROABS 6.9  --   HGB 7.6* 6.9*  HCT 22.7* 20.2*  MCV 93.0 94.0  PLT 199 785   Basic Metabolic Panel: Recent Labs  Lab 05/17/22 0649  NA 131*  K 4.1  CL 101  CO2 22  GLUCOSE 154*  BUN 25*  CREATININE 0.83  CALCIUM 8.4*   GFR: Estimated Creatinine Clearance: 60.3 mL/min (by C-G formula based on SCr of 0.83 mg/dL). Liver Function Tests: Recent Labs  Lab 05/17/22 0649  AST 21  ALT 16  ALKPHOS 29*  BILITOT 0.7  PROT 5.4*  ALBUMIN 3.6   Recent Labs  Lab 05/17/22 0649  LIPASE 45   No results for input(s): "AMMONIA" in the last 168 hours. Coagulation Profile: Recent Labs  Lab 05/17/22 1639  INR 1.2   Cardiac Enzymes: No results for input(s): "CKTOTAL", "CKMB", "CKMBINDEX", "TROPONINI" in the last 168 hours. BNP (last 3 results) No  results for input(s): "PROBNP" in the last 8760 hours. HbA1C: No results for input(s): "HGBA1C" in the last 72 hours. CBG: No results for input(s): "GLUCAP" in the last 168 hours. Lipid Profile: No results for input(s): "CHOL", "HDL", "LDLCALC", "TRIG", "CHOLHDL", "LDLDIRECT" in the last 72 hours. Thyroid Function Tests: No results for input(s): "TSH", "T4TOTAL", "FREET4", "T3FREE", "THYROIDAB" in the last 72 hours. Anemia Panel: Recent Labs    05/17/22 0649  FOLATE 8.6  FERRITIN 21*  TIBC 349  IRON 56  RETICCTPCT 2.0   Urine analysis:    Component Value Date/Time   COLORURINE YELLOW (A) 07/16/2021 0521   APPEARANCEUR CLEAR (A) 07/16/2021 0521   LABSPEC 1.012 07/16/2021 0521   PHURINE 7.0 07/16/2021 0521   GLUCOSEU NEGATIVE 07/16/2021 0521   HGBUR NEGATIVE 07/16/2021 0521   BILIRUBINUR NEGATIVE 07/16/2021 0521   KETONESUR NEGATIVE 07/16/2021 0521   PROTEINUR NEGATIVE 07/16/2021 0521   NITRITE NEGATIVE 07/16/2021 0521   LEUKOCYTESUR NEGATIVE 07/16/2021 0521   Sepsis Labs: '@LABRCNTIP'$ (procalcitonin:4,lacticidven:4) )No results found for this or any previous visit (from the past 240 hour(s)).   Radiological Exams on Admission: CT Angio Abd/Pel W and/or Wo Contrast  Result Date: 05/17/2022 CLINICAL DATA:  Diarrhea, GI bleed. EXAM: CTA ABDOMEN AND PELVIS WITHOUT AND WITH CONTRAST TECHNIQUE: Multidetector CT imaging of the abdomen and pelvis was performed using the standard protocol during bolus administration of intravenous contrast. Multiplanar reconstructed images and MIPs were obtained and reviewed to evaluate the vascular anatomy. RADIATION DOSE REDUCTION: This exam was performed according to the departmental dose-optimization program which includes automated exposure control, adjustment of the mA and/or kV according to patient size and/or use of iterative reconstruction technique. CONTRAST:  87m OMNIPAQUE IOHEXOL 350 MG/ML SOLN COMPARISON:  CT abdomen dated 07/16/2021  FINDINGS: VASCULAR Aorta: Extensive aortic atherosclerosis, including ulcerated plaque of the ectatic infrarenal abdominal aorta. No aortic aneurysm or evidence of acute dissection. No significant stenosis. Celiac: Patent without evidence of aneurysm, dissection, vasculitis or significant stenosis. SMA: Patent without evidence of aneurysm, dissection, vasculitis or significant stenosis. Atherosclerosis at the origin and at the proximal branching of the SMA, but normal contrast flow is seen into the more distal branches without evidence of a hemodynamically significant stenosis. Renals: Atherosclerosis at the bilateral ostia, but normal flow into the renal arteries and distal branches without evidence of a hemodynamically significant stenosis. IMA: Patent without evidence of aneurysm,  dissection, vasculitis or significant stenosis. Inflow: Patent without evidence of aneurysm, dissection, vasculitis or significant stenosis. Proximal Outflow: Bilateral common femoral and visualized portions of the superficial and profunda femoral arteries are patent without evidence of aneurysm, dissection, vasculitis or significant stenosis. Veins: No obvious venous abnormality within the limitations of this arterial phase study. Review of the MIP images confirms the above findings. NON-VASCULAR Lower chest: No acute abnormality. Hepatobiliary: No focal liver abnormality is seen. No gallstones, gallbladder wall thickening, or biliary dilatation. Pancreas: Unremarkable. No pancreatic ductal dilatation or surrounding inflammatory changes. Spleen: Normal in size without focal abnormality. Adrenals/Urinary Tract: Adrenal glands are unremarkable. Kidneys appear normal without mass, stone or hydronephrosis. No perinephric fluid. No ureteral or bladder calculi are identified. Bladder is unremarkable. Stomach/Bowel: No dilated large or small bowel loops. Mild thickening of the walls of the ascending colon, with subtle pericolonic  inflammation/fluid stranding. Appendix is not seen but there are no focal inflammatory changes about the cecum to suggest acute appendicitis. No active hemorrhage is identified within the large or small bowel. Scattered diverticulosis within the sigmoid colon but no focal inflammatory change to suggest acute diverticulitis. Stomach is unremarkable, partially decompressed. Lymphatic: No enlarged lymph nodes are seen. Reproductive: Prostate is unremarkable. Other: Trace free fluid in the lower pelvis. No additional free fluid is seen. No abscess collection. No free intraperitoneal air. Musculoskeletal: No acute-appearing osseous abnormality. Degenerative spondylosis of the slightly scoliotic thoracolumbar spine, mild to moderate in degree. IMPRESSION: 1. No active hemorrhage is identified within the large or small bowel. 2. Mild thickening of the walls of the ascending colon, with subtle pericolonic inflammation/fluid stranding, suspicious for a mild colitis of infectious or inflammatory nature. 3. Colonic diverticulosis without evidence of acute diverticulitis. Aortic Atherosclerosis (ICD10-I70.0). Electronically Signed   By: Franki Cabot M.D.   On: 05/17/2022 09:26      Assessment/Plan Principal Problem:   Diarrhea Active Problems:   GI bleeding   Acute blood loss anemia   HTN (hypertension)   Hypothyroid   BPH (benign prostatic hyperplasia)   Depression   Hyperlipidemia    Assessment and Plan: * Diarrhea Etiology is not clear.  CT angiogram negative negative bleeding, but showed inflammatory change in colons.  Patient may have viral enteritis. -Admit to MedSurg bed as inpatient -Check C. difficile and GI pathogen panel -IV fluid: 1 L LR, followed by 75 cc/h -As needed Zofran  GI bleeding GI bleeding and acute blood loss anemia: Hemoglobin dropped from 12.1-7.6.  Consulted Dr. Vicente Ray of GI  - IVF: 1L LR bolus, then at 75 mL/hr of NS - Start IV pantoprazole 40 bid - Zofran IV for  nausea - Avoid NSAIDs and SQ heparin - Maintain IV access (2 large bore IVs if possible). - Monitor closely and follow q6h cbc, transfuse as necessary, if Hgb<7.0 - LaB: INR, PTT and type screen, anemia panel -Hold aspirin and Mobic  Addendum: anemia panel showed iron deficiency anemia with ferritin 21,  Transferrin saturation (TSAT) is 16%. -will start iron supplement, ferrous sulfate 325 mg daily -Patient will need as needed laxative with diarrhea is resolved -Give 1 dose of IV Feraheme 510 mg  Addendum: hemoglobin dropped to 6.9 -Transfuse 2 unit of blood       Acute blood loss anemia -see above  HTN (hypertension) Patient has history of orthostatic status, will allow for slightly elevated blood pressure -As needed oral hydralazine for SBP> 170  Hypothyroid - Synthroid  BPH (benign prostatic hyperplasia) - Proscar  Depression -  Continue home Celexa  Hyperlipidemia - Crestor              DVT ppx: SCD  Code Status: Full code  Family Communication: I offered to call his wife, but patient states that his wife was with him, and that she knows what is going on for him. She said I do not need to call his family.  Disposition Plan:  Anticipate discharge back to previous environment  Consults called:  Dr. Vicente Ray Is consulted.   Admission status and Level of care: Med-Surg:   as inpt     Severity of Illness:  The appropriate patient status for this patient is INPATIENT. Inpatient status is judged to be reasonable and necessary in order to provide the required intensity of service to ensure the patient's safety. The patient's presenting symptoms, physical exam findings, and initial radiographic and laboratory data in the context of their chronic comorbidities is felt to place them at high risk for further clinical deterioration. Furthermore, it is not anticipated that the patient will be medically stable for discharge from the hospital within 2 midnights of  admission.   * I certify that at the point of admission it is my clinical judgment that the patient will require inpatient hospital care spanning beyond 2 midnights from the point of admission due to high intensity of service, high risk for further deterioration and high frequency of surveillance required.*       Date of Service 05/17/2022    Ivor Costa [facility]   If 7PM-7AM, please contact night-coverage [email or web address] 05/17/2022, 6:41 PM

## 2022-05-17 NOTE — Assessment & Plan Note (Addendum)
Patient has history of orthostatic status, will allow for slightly elevated blood pressure -As needed oral hydralazine for SBP> 170

## 2022-05-17 NOTE — ED Provider Notes (Signed)
[facility] Provider Note    Event Date/Time   First MD Initiated Contact with Patient 05/17/22 0703     (approximate)   History   Chief Complaint Diarrhea   HPI  Brett Ray is a 88 y.o. male with past medical history of hyperlipidemia, hypothyroidism, and sinus bradycardia who presents to the ED complaining of diarrhea.  Patient reports that since yesterday afternoon he has been dealing with frequent bouts of watery diarrhea.  He denies any associated abdominal pain and has not had any nausea or vomiting.  He reports feeling increasingly weak and is concerned he may be dehydrated, has not noticed any blood in his stool or dark tarry stool.  He has not had any fevers, cough, chest pain, or shortness of breath.  He is not aware of any sick contacts and has not had any recent courses of antibiotics.  He denies any history of similar symptoms in the past.     Physical Exam   Triage Vital Signs: ED Triage Vitals  Enc Vitals Group     BP 05/17/22 0642 124/74     Pulse Rate 05/17/22 0642 74     Resp 05/17/22 0642 18     Temp 05/17/22 0642 (!) 97.5 F (36.4 C)     Temp Source 05/17/22 0642 Oral     SpO2 05/17/22 0642 100 %     Weight 05/17/22 0644 156 lb (70.8 kg)     Height 05/17/22 0644 '5\' 9"'$  (1.753 m)     Head Circumference --      Peak Flow --      Pain Score 05/17/22 0642 0     Pain Loc --      Pain Edu? --      Excl. in [place]? --     Most recent vital signs: Vitals:   05/17/22 0810 05/17/22 0819  BP: (!) 157/72 (!) 157/72  Pulse: 65 65  Resp:  16  Temp:    SpO2: 100% 98%    Constitutional: Alert and oriented. Eyes: Conjunctivae are normal. Head: Atraumatic. Nose: No congestion/rhinnorhea. Mouth/Throat: Mucous membranes are moist.  Cardiovascular: Normal rate, regular rhythm. Grossly normal heart sounds.  2+ radial pulses bilaterally. Respiratory: Normal respiratory effort.  No retractions. Lungs CTAB. Gastrointestinal: Soft and  nontender. No distention.  Rectal exam with maroon stool that is guaiac positive. Musculoskeletal: No lower extremity tenderness nor edema.  Neurologic:  Normal speech and language. No gross focal neurologic deficits are appreciated.    ED Results / Procedures / Treatments   Labs (all labs ordered are listed, but only abnormal results are displayed) Labs Reviewed  COMPREHENSIVE METABOLIC PANEL - Abnormal; Notable for the following components:      Result Value   Sodium 131 (*)    Glucose, Bld 154 (*)    BUN 25 (*)    Calcium 8.4 (*)    Total Protein 5.4 (*)    Alkaline Phosphatase 29 (*)    All other components within normal limits  CBC WITH DIFFERENTIAL/PLATELET - Abnormal; Notable for the following components:   RBC 2.44 (*)    Hemoglobin 7.6 (*)    HCT 22.7 (*)    All other components within normal limits  GASTROINTESTINAL PANEL BY PCR, STOOL (REPLACES STOOL CULTURE)  C DIFFICILE QUICK SCREEN W PCR REFLEX    LIPASE, BLOOD  TYPE AND SCREEN     EKG  ED ECG REPORT I, Blake Divine, the attending physician, personally viewed and interpreted  this ECG.   Date: 05/17/2022  EKG Time: 6:44  Rate: 55  Rhythm: sinus bradycardia  Axis: Normal  Intervals:right bundle branch block  ST&T Change: None  RADIOLOGY CTA of abdomen/pelvis reviewed and interpreted by me with no active extravasation of contrast into the GI tract noted.  PROCEDURES:  Critical Care performed: No  Procedures   MEDICATIONS ORDERED IN ED: Medications  0.9 %  sodium chloride infusion (has no administration in time range)  lactated ringers bolus 1,000 mL (0 mLs Intravenous Stopped 05/17/22 0734)  pantoprazole (PROTONIX) injection 40 mg (40 mg Intravenous Given 05/17/22 0807)  iohexol (OMNIPAQUE) 350 MG/ML injection 80 mL (80 mLs Intravenous Contrast Given 05/17/22 0743)     IMPRESSION / MDM / ASSESSMENT AND PLAN / ED COURSE  I reviewed the triage vital signs and the nursing notes.                               88 y.o. male with past medical history of hyperlipidemia, hypothyroidism, and sinus bradycardia who presents to the ED complaining of generalized weakness with about 24 hours of watery diarrhea.  Patient's presentation is most consistent with acute presentation with potential threat to life or bodily function.  Differential diagnosis includes, but is not limited to, gastroenteritis, dehydration, AKI, electrolyte abnormality, GI bleed, anemia, arrhythmia, colitis, C. difficile.  Patient nontoxic-appearing and in no acute distress, vital signs are unremarkable.  He does remain bradycardic but has a history of this and EKG shows sinus bradycardia unchanged from previous.  Initial labs are concerning for anemia with hemoglobin of 7.6 that is an acute drop from April, rectal exam confirms GI bleeding.  Patient is not anticoagulated, suspect lower GI bleed but we will cover with IV Protonix for now.  We will further assess with CTA of his abdomen/pelvis, plan for admission.  Additional labs are reassuring with no significant electrolyte abnormality or AKI, LFTs are within normal limits.  Type and screen was ordered but we will hold off on transfusion for now unless hemoglobin trends below 7 or patient becomes hemodynamically unstable.  CTA of abdomen/pelvis shows no evidence of active bleeding, does show signs of inflammatory or infectious colitis which could be contributing to his bleeding.  He remains hemodynamically stable at this time with no further bloody stools here in the ED, will hold off on transfusion.  Case discussed with hospitalist for admission.      FINAL CLINICAL IMPRESSION(S) / ED DIAGNOSES   Final diagnoses:  Gastrointestinal hemorrhage, unspecified gastrointestinal hemorrhage type  Anemia, unspecified type     Rx / DC Orders   ED Discharge Orders     None        Note:  This document was prepared using Dragon voice recognition software and may include  unintentional dictation errors.   Blake Divine, MD 05/17/22 [phone]

## 2022-05-17 NOTE — ED Provider Triage Note (Signed)
Emergency Medicine Provider Triage Evaluation Note  Brett Ray , a 88 y.o. male  was evaluated in triage.  Pt complains of diarrhea.  Feels generalized weakness.  No pain, no N/V.  Review of Systems  Positive: Diarrhea for a couple of days.  Generalized weakness. Negative: Nausea, vomiting, abdominal pain, dysuria.  No recent antibiotic use.  Physical Exam  There were no vitals taken for this visit. Gen:   Awake, no distress .  Appears younger than chronological age. Resp:  Normal effort  MSK:   Moves extremities without difficulty  Other:  No abdominal distention.  Medical Decision Making  Medically screening exam initiated at 6:41 AM.  Appropriate orders placed.  Kevontae Burgoon was informed that the remainder of the evaluation will be completed by another provider, this initial triage assessment does not replace that evaluation, and the importance of remaining in the ED until their evaluation is complete.     Hinda Kehr, MD 05/17/22 [phone]

## 2022-05-17 NOTE — Assessment & Plan Note (Signed)
-   Crestor 

## 2022-05-17 NOTE — Consult Note (Signed)
Brett Ray , MD [address], [place], [place], [place], [address] [address], [place], [place], [place], [address] Phone: [phone]  Fax: [phone]  Consultation  Referring Provider:     Dr Blaine Hamper Primary Care Physician:  Derinda Late, MD Primary Gastroenterologist:  None          Reason for Consultation:     GI bleed  Date of Admission:  05/17/2022 Date of Consultation:  05/17/2022         HPI:   Brett Ray is a 88 y.o. male presented to the emergency room with diarrhea for 1 day duration.Patient with been quite hemoglobin of 7.6 g which was 13.1 g 10 months back.  Apparently no bleeding has been noted externally but rectal exam by ER physician showed blood on the glove.CT angiogram was performed earlier today showed mild thickening of the walls of the ascending colon with active pericolonic inflammation suspicious for mild colitis or infectious etiology   He says that the diarrhea began 1 day back, watery , non bloody , no abdominal pain , no overt blood loss, been taking meloxicam daily for many years, no recent colonoscopy or endoscopy .  Retired Chief Financial Officer, has an active lifestyle.   Did have some [place] food recently . No other sick contacts/.   Past Medical History:  Diagnosis Date   Asymptomatic Sinus bradycardia    Hyperlipidemia    Hypothyroidism    Moderate mitral regurgitation    a. 09/2018 Echo: Mod MR.   Orthostasis    Syncope    a. 09/2018 syncope in the setting of wine/empty stomach/sildenafil/orthostasis; b. 09/2018 Echo: EF 55-60%, no rwma, mild AI, mod MR, mildly dil RV/RA. PASP 79mHg; c. 09/2018 Carotid U/S: <39% bilat ICA dzs.    Past Surgical History:  Procedure Laterality Date   APPENDECTOMY     SHOULDER ARTHROSCOPY      Prior to Admission medications   Medication Sig Start Date End Date Taking? Authorizing Provider  aspirin EC 81 MG tablet Take 81 mg by mouth daily. Patient not taking: Reported on 06/16/2021    [provider]  bimatoprost (LUMIGAN) 0.03 % ophthalmic solution Place 1 drop into both eyes at bedtime. Patient not taking: Reported on 06/16/2021    [provider]  bimatoprost (LUMIGAN) 0.03 % ophthalmic solution Place 1 drop into both eyes in the morning and at bedtime. Patient not taking: Reported on 06/16/2021    [provider]  calcium carbonate (TUMS - DOSED IN MG ELEMENTAL CALCIUM) 500 MG chewable tablet Chew 1 tablet by mouth 2 (two) times daily as needed for indigestion or heartburn.    [provider]  citalopram (CELEXA) 10 MG tablet Take 1 tablet by mouth daily. 08/28/18   [provider]  finasteride (PROSCAR) 5 MG tablet Take 5 mg by mouth daily.    [provider]  fluticasone (VERAMYST) 27.5 MCG/SPRAY nasal spray Place 2 sprays into the nose daily.    [provider]  hydrALAZINE (APRESOLINE) 25 MG tablet TAKE 1 TABLET BY MOUTH THREE TIMES DAILY AS NEEDED( PRESSURE> 170) 03/14/22   Gollan, TKathlene November MD  HYDROcodone-acetaminophen (NORCO/VICODIN) 5-325 MG tablet Norco 5 mg-325 mg tablet  1 PO q 6 hrs prn pain    [provider]  latanoprost (XALATAN) 0.005 % ophthalmic solution 1 drop at bedtime. 05/19/21   [provider]  levothyroxine (SYNTHROID, LEVOTHROID) 125 MCG tablet Take 125 mcg by mouth daily before breakfast.  [provider]  LUMIGAN 0.01 % SOLN INT 1 GTT IN OU QPM Patient not taking: Reported on 06/16/2021 07/27/19   [provider]  meloxicam (MOBIC) 15 MG tablet Take 1 tablet by mouth daily. 07/18/18   [provider]  polyethylene glycol (MIRALAX / GLYCOLAX) packet Take 17 g by mouth daily.    [provider]  Probiotic Product (PROBIOTIC DAILY PO) Take by mouth daily.    [provider]  rosuvastatin (CRESTOR) 20 MG tablet TAKE 1 TABLET(20 MG) BY MOUTH AT BEDTIME 04/27/22   Minna Merritts, MD  vitamin C (ASCORBIC ACID) 500 MG tablet Take 500 mg by mouth daily.     [provider]    History reviewed. No pertinent family history.   Social History   Tobacco Use   Smoking status: Never   Smokeless tobacco: Never  Vaping Use   Vaping Use: Never used  Substance Use Topics   Alcohol use: Yes    Alcohol/week: 2.0 standard drinks of alcohol    Types: 2 Glasses of wine per week    Comment: every evening   Drug use: Never    Allergies as of 05/17/2022   (No Known Allergies)    Review of Systems:    All systems reviewed and negative except where noted in HPI.   Physical Exam:  Vital signs in last 24 hours: Temp:  [97.5 F (36.4 C)] 97.5 F (36.4 C) (07/04 1146) Pulse Rate:  [65-74] 65 (07/04 0819) Resp:  [16-18] 16 (07/04 0819) BP: (93-157)/(72-78) 157/72 (07/04 0819) SpO2:  [98 %-100 %] 98 % (07/04 0819) Weight:  [70.8 kg] 70.8 kg (07/04 0644)   General:   Pleasant, cooperative in NAD Head:  Normocephalic and atraumatic. Eyes:   No icterus.   Conjunctiva pink. PERRLA. Ears:  Normal auditory acuity. Neck:  Supple; no masses or thyroidomegaly Lungs: Respirations even and unlabored. Lungs clear to auscultation bilaterally.   No wheezes, crackles, or rhonchi.  Heart:  Regular rate and rhythm;  Without murmur, clicks, rubs or gallops Abdomen:  Soft, nondistended, nontender. Normal bowel sounds. No appreciable masses or hepatomegaly.  No rebound or guarding.  Neurologic:  Alert and oriented x3;  grossly normal neurologically.  Psych:  Alert and cooperative. Normal affect.  LAB RESULTS: Recent Labs    05/17/22 0649  WBC 8.9  HGB 7.6*  HCT 22.7*  PLT 199   BMET Recent Labs    05/17/22 0649  NA 131*  K 4.1  CL 101  CO2 22  GLUCOSE 154*  BUN 25*  CREATININE 0.83  CALCIUM 8.4*   LFT Recent Labs    05/17/22 0649  PROT 5.4*  ALBUMIN 3.6  AST 21  ALT 16  ALKPHOS 29*  BILITOT 0.7   PT/INR No results for input(s): "LABPROT", "INR" in the last 72 hours.  STUDIES: CT Angio Abd/Pel W and/or Wo  Contrast  Result Date: 05/17/2022 CLINICAL DATA:  Diarrhea, GI bleed. EXAM: CTA ABDOMEN AND PELVIS WITHOUT AND WITH CONTRAST TECHNIQUE: Multidetector CT imaging of the abdomen and pelvis was performed using the standard protocol during bolus administration of intravenous contrast. Multiplanar reconstructed images and MIPs were obtained and reviewed to evaluate the vascular anatomy. RADIATION DOSE REDUCTION: This exam was performed according to the departmental dose-optimization program which includes automated exposure control, adjustment of the mA and/or kV according to patient size and/or use of iterative reconstruction technique. CONTRAST:  11m OMNIPAQUE IOHEXOL 350 MG/ML SOLN COMPARISON:  CT abdomen dated 07/16/2021 FINDINGS:  VASCULAR Aorta: Extensive aortic atherosclerosis, including ulcerated plaque of the ectatic infrarenal abdominal aorta. No aortic aneurysm or evidence of acute dissection. No significant stenosis. Celiac: Patent without evidence of aneurysm, dissection, vasculitis or significant stenosis. SMA: Patent without evidence of aneurysm, dissection, vasculitis or significant stenosis. Atherosclerosis at the origin and at the proximal branching of the SMA, but normal contrast flow is seen into the more distal branches without evidence of a hemodynamically significant stenosis. Renals: Atherosclerosis at the bilateral ostia, but normal flow into the renal arteries and distal branches without evidence of a hemodynamically significant stenosis. IMA: Patent without evidence of aneurysm, dissection, vasculitis or significant stenosis. Inflow: Patent without evidence of aneurysm, dissection, vasculitis or significant stenosis. Proximal Outflow: Bilateral common femoral and visualized portions of the superficial and profunda femoral arteries are patent without evidence of aneurysm, dissection, vasculitis or significant stenosis. Veins: No obvious venous abnormality within the limitations of this arterial  phase study. Review of the MIP images confirms the above findings. NON-VASCULAR Lower chest: No acute abnormality. Hepatobiliary: No focal liver abnormality is seen. No gallstones, gallbladder wall thickening, or biliary dilatation. Pancreas: Unremarkable. No pancreatic ductal dilatation or surrounding inflammatory changes. Spleen: Normal in size without focal abnormality. Adrenals/Urinary Tract: Adrenal glands are unremarkable. Kidneys appear normal without mass, stone or hydronephrosis. No perinephric fluid. No ureteral or bladder calculi are identified. Bladder is unremarkable. Stomach/Bowel: No dilated large or small bowel loops. Mild thickening of the walls of the ascending colon, with subtle pericolonic inflammation/fluid stranding. Appendix is not seen but there are no focal inflammatory changes about the cecum to suggest acute appendicitis. No active hemorrhage is identified within the large or small bowel. Scattered diverticulosis within the sigmoid colon but no focal inflammatory change to suggest acute diverticulitis. Stomach is unremarkable, partially decompressed. Lymphatic: No enlarged lymph nodes are seen. Reproductive: Prostate is unremarkable. Other: Trace free fluid in the lower pelvis. No additional free fluid is seen. No abscess collection. No free intraperitoneal air. Musculoskeletal: No acute-appearing osseous abnormality. Degenerative spondylosis of the slightly scoliotic thoracolumbar spine, mild to moderate in degree. IMPRESSION: 1. No active hemorrhage is identified within the large or small bowel. 2. Mild thickening of the walls of the ascending colon, with subtle pericolonic inflammation/fluid stranding, suspicious for a mild colitis of infectious or inflammatory nature. 3. Colonic diverticulosis without evidence of acute diverticulitis. Aortic Atherosclerosis (ICD10-I70.0). Electronically Signed   By: Franki Cabot M.D.   On: 05/17/2022 09:26      Impression / Plan:   Jacobey Gura  is a 88 y.o. y/o male admitted with a short history of diarrhea incidental finding of a drop of hemoglobin from baseline of 13 grams. Found to have a sinus bradycardia .Low iron - likely anemia due to meloxicam use  Plan  Monitor CBC and transfuse as needed 2.  Follow-up stool studies iron studies B12 folate.   Replace iron with IV iron  EGD+colonoscopy to evaluate iron def anemia on Thursday or Friday when he recovers from likely acute diarrhea likely from food poisoning.  Stop nsaid use  PPI  I have discussed alternative options, risks & benefits,  which include, but are not limited to, bleeding, infection, perforation,respiratory complication & drug reaction.  The patient agrees with this plan & written consent will be obtained.    Thank you for involving me in the care of this patient.      LOS: 0 days   Brett Bellows, MD  05/17/2022, 12:49 PM

## 2022-05-17 NOTE — Progress Notes (Signed)
Patient arrived to 1C room 126. VSS. Patient in bed resting.

## 2022-05-17 NOTE — Assessment & Plan Note (Signed)
Etiology is not clear.  CT angiogram negative negative bleeding, but showed inflammatory change in colons.  Patient may have viral enteritis. -Admit to MedSurg bed as inpatient -Check C. difficile and GI pathogen panel -IV fluid: 1 L LR, followed by 75 cc/h -As needed Zofran

## 2022-05-17 NOTE — ED Triage Notes (Signed)
Pt arrived via [facility] from home with reports of multiple bouts of diarrhea, pt states the diarrhea started yesterday morning and resolved throughout the day then started again this morning. Pt also c/o weakness and was unable to get in the car.  Per EMS pt was pale on arrival.

## 2022-05-18 DIAGNOSIS — D649 Anemia, unspecified: Secondary | ICD-10-CM | POA: Diagnosis not present

## 2022-05-18 LAB — CBC
HCT: 19.3 % — ABNORMAL LOW (ref 39.0–52.0)
HCT: 22.4 % — ABNORMAL LOW (ref 39.0–52.0)
Hemoglobin: 6.5 g/dL — ABNORMAL LOW (ref 13.0–17.0)
Hemoglobin: 7.8 g/dL — ABNORMAL LOW (ref 13.0–17.0)
MCH: 31.4 pg (ref 26.0–34.0)
MCH: 30.8 pg (ref 26.0–34.0)
MCHC: 33.7 g/dL (ref 30.0–36.0)
MCHC: 34.8 g/dL (ref 30.0–36.0)
MCV: 93.2 fL (ref 80.0–100.0)
MCV: 88.5 fL (ref 80.0–100.0)
Platelets: 154 10*3/uL (ref 150–400)
Platelets: 145 10*3/uL — ABNORMAL LOW (ref 150–400)
RBC: 2.07 MIL/uL — ABNORMAL LOW (ref 4.22–5.81)
RBC: 2.53 MIL/uL — ABNORMAL LOW (ref 4.22–5.81)
RDW: 14.5 % (ref 11.5–15.5)
RDW: 16 % — ABNORMAL HIGH (ref 11.5–15.5)
WBC: 7.7 10*3/uL (ref 4.0–10.5)
WBC: 6.3 10*3/uL (ref 4.0–10.5)
nRBC: 0 % (ref 0.0–0.2)
nRBC: 0 % (ref 0.0–0.2)

## 2022-05-18 LAB — HEMOGLOBIN AND HEMATOCRIT, BLOOD
HCT: 22.5 % — ABNORMAL LOW (ref 39.0–52.0)
Hemoglobin: 7.7 g/dL — ABNORMAL LOW (ref 13.0–17.0)

## 2022-05-18 LAB — VITAMIN B12: Vitamin B-12: 202 pg/mL (ref 180–914)

## 2022-05-18 LAB — PREPARE RBC (CROSSMATCH)

## 2022-05-18 LAB — GLUCOSE, CAPILLARY: Glucose-Capillary: 106 mg/dL — ABNORMAL HIGH (ref 70–99)

## 2022-05-18 MED ORDER — SODIUM CHLORIDE 0.9% IV SOLUTION: Freq: Once | INTRAVENOUS | Status: AC

## 2022-05-18 MED ORDER — PEG 3350-KCL-NA BICARB-NACL 420 G PO SOLR
4000.0000 mL | Freq: Once | ORAL | Status: AC
  Administered 2022-05-18: 4000 mL via ORAL
  Filled 2022-05-18: qty 4000

## 2022-05-18 MED ORDER — NA FERRIC GLUC CPLX IN SUCROSE 12.5 MG/ML IV SOLN: 250.0000 mg | Freq: Once | INTRAVENOUS | Status: DC

## 2022-05-18 MED ORDER — SODIUM CHLORIDE 0.9 % IV SOLN: INTRAVENOUS | Status: DC

## 2022-05-18 NOTE — Progress Notes (Signed)
[name] , MD [address] [place], [place], [place], [address] [address], [place], [place], [place], [address] Phone: [phone]  Fax: [phone]   Brett Ray is being followed for iron deficiency anemia day 2 of follow up   Subjective: Denies any abdominal pain, no rectal bleeding, no bowel movement since yesterday, no hematemesis or no blood seen anywhere   Objective: Vital signs in last 24 hours: Vitals:   05/18/22 0059 05/18/22 0501 05/18/22 0534 05/18/22 0808  BP: (!) 148/56 (!) 148/55 (!) 144/63 (!) 158/64  Pulse: 60 (!) 55  (!) 58  Resp: '16 18 18 16  '$ Temp: 98.2 F (36.8 C) 98 F (36.7 C) 98.1 F (36.7 C) 98.4 F (36.9 C)  TempSrc: Oral Oral Oral Oral  SpO2: 100% 98%  98%  Weight:      Height:       Weight change:   Intake/Output Summary (Last 24 hours) at 05/18/2022 1497 Last data filed at 05/18/2022 0263 Gross per 24 hour  Intake 676 ml  Output --  Net 676 ml     Exam: Heart:: Regular rate and rhythm, S1S2 present, or without murmur or extra heart sounds Lungs: normal and clear to auscultation Abdomen: soft, nontender, normal bowel sounds   Lab Results: '@LABTEST2'$ @ Micro Results: No results found for this or any previous visit (from the past 240 hour(s)). Studies/Results: CT Angio Abd/Pel W and/or Wo Contrast  Result Date: 05/17/2022 CLINICAL DATA:  Diarrhea, GI bleed. EXAM: CTA ABDOMEN AND PELVIS WITHOUT AND WITH CONTRAST TECHNIQUE: Multidetector CT imaging of the abdomen and pelvis was performed using the standard protocol during bolus administration of intravenous contrast. Multiplanar reconstructed images and MIPs were obtained and reviewed to evaluate the vascular anatomy. RADIATION DOSE REDUCTION: This exam was performed according to the departmental dose-optimization program which includes automated exposure control, adjustment of the mA and/or kV according to patient size and/or use of iterative reconstruction technique. CONTRAST:   12m OMNIPAQUE IOHEXOL 350 MG/ML SOLN COMPARISON:  CT abdomen dated 07/16/2021 FINDINGS: VASCULAR Aorta: Extensive aortic atherosclerosis, including ulcerated plaque of the ectatic infrarenal abdominal aorta. No aortic aneurysm or evidence of acute dissection. No significant stenosis. Celiac: Patent without evidence of aneurysm, dissection, vasculitis or significant stenosis. SMA: Patent without evidence of aneurysm, dissection, vasculitis or significant stenosis. Atherosclerosis at the origin and at the proximal branching of the SMA, but normal contrast flow is seen into the more distal branches without evidence of a hemodynamically significant stenosis. Renals: Atherosclerosis at the bilateral ostia, but normal flow into the renal arteries and distal branches without evidence of a hemodynamically significant stenosis. IMA: Patent without evidence of aneurysm, dissection, vasculitis or significant stenosis. Inflow: Patent without evidence of aneurysm, dissection, vasculitis or significant stenosis. Proximal Outflow: Bilateral common femoral and visualized portions of the superficial and profunda femoral arteries are patent without evidence of aneurysm, dissection, vasculitis or significant stenosis. Veins: No obvious venous abnormality within the limitations of this arterial phase study. Review of the MIP images confirms the above findings. NON-VASCULAR Lower chest: No acute abnormality. Hepatobiliary: No focal liver abnormality is seen. No gallstones, gallbladder wall thickening, or biliary dilatation. Pancreas: Unremarkable. No pancreatic ductal dilatation or surrounding inflammatory changes. Spleen: Normal in size without focal abnormality. Adrenals/Urinary Tract: Adrenal glands are unremarkable. Kidneys appear normal without mass, stone or hydronephrosis. No perinephric fluid. No ureteral or bladder calculi are identified. Bladder is unremarkable. Stomach/Bowel: No dilated large or small bowel loops. Mild  thickening of the walls of  the ascending colon, with subtle pericolonic inflammation/fluid stranding. Appendix is not seen but there are no focal inflammatory changes about the cecum to suggest acute appendicitis. No active hemorrhage is identified within the large or small bowel. Scattered diverticulosis within the sigmoid colon but no focal inflammatory change to suggest acute diverticulitis. Stomach is unremarkable, partially decompressed. Lymphatic: No enlarged lymph nodes are seen. Reproductive: Prostate is unremarkable. Other: Trace free fluid in the lower pelvis. No additional free fluid is seen. No abscess collection. No free intraperitoneal air. Musculoskeletal: No acute-appearing osseous abnormality. Degenerative spondylosis of the slightly scoliotic thoracolumbar spine, mild to moderate in degree. IMPRESSION: 1. No active hemorrhage is identified within the large or small bowel. 2. Mild thickening of the walls of the ascending colon, with subtle pericolonic inflammation/fluid stranding, suspicious for a mild colitis of infectious or inflammatory nature. 3. Colonic diverticulosis without evidence of acute diverticulitis. Aortic Atherosclerosis (ICD10-I70.0). Electronically Signed   By: Franki Cabot M.D.   On: 05/17/2022 09:26   Medications: I have reviewed the patient's current medications. Scheduled Meds:  acidophilus  1 capsule Oral Daily   vitamin C  500 mg Oral Daily   citalopram  10 mg Oral Daily   ferrous sulfate  325 mg Oral Q breakfast   finasteride  5 mg Oral Daily   latanoprost  1 drop Both Eyes QHS   levothyroxine  137 mcg Oral QAC breakfast   methocarbamol  500 mg Oral BID   pantoprazole (PROTONIX) IV  40 mg Intravenous Q12H   rosuvastatin  20 mg Oral Daily   Continuous Infusions:  sodium chloride 75 mL/hr at 05/17/22 1012   PRN Meds:.acetaminophen, calcium carbonate, fluticasone, hydrALAZINE, ondansetron (ZOFRAN) IV   Assessment: Principal Problem:   Diarrhea Active  Problems:   Depression   Hypothyroid   Acute blood loss anemia   GI bleeding   Hyperlipidemia   HTN (hypertension)   BPH (benign prostatic hyperplasia) Brett Ray is a 88 y.o. y/o male admitted with a short history of diarrhea incidental finding of a drop of hemoglobin from baseline of 13 grams. Found to have a sinus bradycardia .Low iron - likely anemia due to meloxicam use   Plan  Monitor CBC and transfuse as needed, aim for hemoglobin over 7 g IV iron for low ferritin Stop all NSAID use EGD and colonoscopy tomorrow   I have discussed alternative options, risks & benefits,  which include, but are not limited to, bleeding, infection, perforation,respiratory complication & drug reaction.  The patient agrees with this plan & written consent will be obtained.        LOS: 1 day   [name], MD 05/18/2022, 9:06 AM

## 2022-05-18 NOTE — Progress Notes (Signed)
PROGRESS NOTE    Brett Ray  ZOX:[id number] DOB: 1930-11-19 DOA: 05/17/2022 PCP: Derinda Late, MD    Brief Narrative:  88 year old gentleman with history of hypertension, hyperlipidemia, hypothyroidism, mitral valve regurgitation, BPH presented to the emergency room with diarrhea and dark stool.  In the emergency room hemodynamically stable.  Hemoglobin 7.6 from recent hemoglobin of 12.  FOBT positive.  Blood pressure stable.  CTA of the abdomen pelvis negative for acute bleeding.  Admitted with acute GI bleeding.   Assessment & Plan:   Acute blood loss anemia , symptomatic anemia, low ferritin: Baseline hemoglobin 12-presentation hemoglobin 7.6-6.5 after hydration-2 units of PRBC.  Appropriately responded today. Remains on IV Protonix. No evidence of ongoing GI bleeding, however significant drop in hemoglobin.  Clears today.  N.p.o. past midnight for EGD and colonoscopy by GI tomorrow. Also received 1 dose of Feraheme.  Holding aspirin and Mobic. Hemoglobin 7.8. Check hemoglobin every 12 hours, transfuse for less than 7.  Essential hypertension: Blood pressure stable.  Holding antihypertensives.  Hypothyroidism: On Synthroid.  Depression on Celexa.    DVT prophylaxis: SCDs Start: 05/17/22 0957   Code Status: Full code Family Communication: Wife at the bedside Disposition Plan: Status is: Inpatient Remains inpatient appropriate because: Active GI bleeding.  Inpatient procedures planned.     Consultants:  Gastroenterology  Procedures:  Planned  Antimicrobials:  None   Subjective: Patient seen and examined.  Today denies any complaints.  Denies any nausea.  No bowel movement since admission.  No evidence of ongoing diarrhea or bleeding.  Objective: Vitals:   05/18/22 0059 05/18/22 0501 05/18/22 0534 05/18/22 0808  BP: (!) 148/56 (!) 148/55 (!) 144/63 (!) 158/64  Pulse: 60 (!) 55  (!) 58  Resp: '16 18 18 16  '$ Temp: 98.2 F (36.8 C) 98 F (36.7 C) 98.1 F  (36.7 C) 98.4 F (36.9 C)  TempSrc: Oral Oral Oral Oral  SpO2: 100% 98%  98%  Weight:      Height:        Intake/Output Summary (Last 24 hours) at 05/18/2022 1326 Last data filed at 05/18/2022 0836 Gross per 24 hour  Intake 1014 ml  Output --  Net 1014 ml   Filed Weights   05/17/22 0644  Weight: 70.8 kg    Examination:  General exam: Appears calm and comfortable  Respiratory system: Clear to auscultation. Respiratory effort normal. Cardiovascular system: S1 & S2 heard, RRR. No JVD, murmurs, rubs, gallops or clicks. No pedal edema. Gastrointestinal system: Abdomen is nondistended, soft and nontender. No organomegaly or masses felt. Normal bowel sounds heard. Central nervous system: Alert and oriented. No focal neurological deficits. Extremities: Symmetric 5 x 5 power. Skin: No rashes, lesions or ulcers Psychiatry: Judgement and insight appear normal. Mood & affect appropriate.     Data Reviewed: I have personally reviewed following labs and imaging studies  CBC: Recent Labs  Lab 05/17/22 0649 05/17/22 1639 05/17/22 2134 05/18/22 0413 05/18/22 1035  WBC 8.9 8.2 6.9 7.7 6.3  NEUTROABS 6.9  --   --   --   --   HGB 7.6* 6.9* 6.0* 6.5* 7.8*  HCT 22.7* 20.2* 17.6* 19.3* 22.4*  MCV 93.0 94.0 93.6 93.2 88.5  PLT 199 191 164 154 811*   Basic Metabolic Panel: Recent Labs  Lab 05/17/22 0649  NA 131*  K 4.1  CL 101  CO2 22  GLUCOSE 154*  BUN 25*  CREATININE 0.83  CALCIUM 8.4*   GFR: Estimated Creatinine Clearance: 60.3 mL/min (by C-G  formula based on SCr of 0.83 mg/dL). Liver Function Tests: Recent Labs  Lab 05/17/22 0649  AST 21  ALT 16  ALKPHOS 29*  BILITOT 0.7  PROT 5.4*  ALBUMIN 3.6   Recent Labs  Lab 05/17/22 0649  LIPASE 45   No results for input(s): "AMMONIA" in the last 168 hours. Coagulation Profile: Recent Labs  Lab 05/17/22 1639  INR 1.2   Cardiac Enzymes: No results for input(s): "CKTOTAL", "CKMB", "CKMBINDEX", "TROPONINI" in the  last 168 hours. BNP (last 3 results) No results for input(s): "PROBNP" in the last 8760 hours. HbA1C: No results for input(s): "HGBA1C" in the last 72 hours. CBG: Recent Labs  Lab 05/18/22 0807  GLUCAP 106*   Lipid Profile: No results for input(s): "CHOL", "HDL", "LDLCALC", "TRIG", "CHOLHDL", "LDLDIRECT" in the last 72 hours. Thyroid Function Tests: No results for input(s): "TSH", "T4TOTAL", "FREET4", "T3FREE", "THYROIDAB" in the last 72 hours. Anemia Panel: Recent Labs    05/17/22 0649 05/17/22 1639  VITAMINB12  --  202  FOLATE 8.6  --   FERRITIN 21*  --   TIBC 349  --   IRON 56  --   RETICCTPCT 2.0  --    Sepsis Labs: No results for input(s): "PROCALCITON", "LATICACIDVEN" in the last 168 hours.  No results found for this or any previous visit (from the past 240 hour(s)).       Radiology Studies: CT Angio Abd/Pel W and/or Wo Contrast  Result Date: 05/17/2022 CLINICAL DATA:  Diarrhea, GI bleed. EXAM: CTA ABDOMEN AND PELVIS WITHOUT AND WITH CONTRAST TECHNIQUE: Multidetector CT imaging of the abdomen and pelvis was performed using the standard protocol during bolus administration of intravenous contrast. Multiplanar reconstructed images and MIPs were obtained and reviewed to evaluate the vascular anatomy. RADIATION DOSE REDUCTION: This exam was performed according to the departmental dose-optimization program which includes automated exposure control, adjustment of the mA and/or kV according to patient size and/or use of iterative reconstruction technique. CONTRAST:  19m OMNIPAQUE IOHEXOL 350 MG/ML SOLN COMPARISON:  CT abdomen dated 07/16/2021 FINDINGS: VASCULAR Aorta: Extensive aortic atherosclerosis, including ulcerated plaque of the ectatic infrarenal abdominal aorta. No aortic aneurysm or evidence of acute dissection. No significant stenosis. Celiac: Patent without evidence of aneurysm, dissection, vasculitis or significant stenosis. SMA: Patent without evidence of aneurysm,  dissection, vasculitis or significant stenosis. Atherosclerosis at the origin and at the proximal branching of the SMA, but normal contrast flow is seen into the more distal branches without evidence of a hemodynamically significant stenosis. Renals: Atherosclerosis at the bilateral ostia, but normal flow into the renal arteries and distal branches without evidence of a hemodynamically significant stenosis. IMA: Patent without evidence of aneurysm, dissection, vasculitis or significant stenosis. Inflow: Patent without evidence of aneurysm, dissection, vasculitis or significant stenosis. Proximal Outflow: Bilateral common femoral and visualized portions of the superficial and profunda femoral arteries are patent without evidence of aneurysm, dissection, vasculitis or significant stenosis. Veins: No obvious venous abnormality within the limitations of this arterial phase study. Review of the MIP images confirms the above findings. NON-VASCULAR Lower chest: No acute abnormality. Hepatobiliary: No focal liver abnormality is seen. No gallstones, gallbladder wall thickening, or biliary dilatation. Pancreas: Unremarkable. No pancreatic ductal dilatation or surrounding inflammatory changes. Spleen: Normal in size without focal abnormality. Adrenals/Urinary Tract: Adrenal glands are unremarkable. Kidneys appear normal without mass, stone or hydronephrosis. No perinephric fluid. No ureteral or bladder calculi are identified. Bladder is unremarkable. Stomach/Bowel: No dilated large or small bowel loops. Mild thickening of the  walls of the ascending colon, with subtle pericolonic inflammation/fluid stranding. Appendix is not seen but there are no focal inflammatory changes about the cecum to suggest acute appendicitis. No active hemorrhage is identified within the large or small bowel. Scattered diverticulosis within the sigmoid colon but no focal inflammatory change to suggest acute diverticulitis. Stomach is unremarkable,  partially decompressed. Lymphatic: No enlarged lymph nodes are seen. Reproductive: Prostate is unremarkable. Other: Trace free fluid in the lower pelvis. No additional free fluid is seen. No abscess collection. No free intraperitoneal air. Musculoskeletal: No acute-appearing osseous abnormality. Degenerative spondylosis of the slightly scoliotic thoracolumbar spine, mild to moderate in degree. IMPRESSION: 1. No active hemorrhage is identified within the large or small bowel. 2. Mild thickening of the walls of the ascending colon, with subtle pericolonic inflammation/fluid stranding, suspicious for a mild colitis of infectious or inflammatory nature. 3. Colonic diverticulosis without evidence of acute diverticulitis. Aortic Atherosclerosis (ICD10-I70.0). Electronically Signed   By: Franki Cabot M.D.   On: 05/17/2022 09:26        Scheduled Meds:  acidophilus  1 capsule Oral Daily   vitamin C  500 mg Oral Daily   citalopram  10 mg Oral Daily   ferrous sulfate  325 mg Oral Q breakfast   finasteride  5 mg Oral Daily   latanoprost  1 drop Both Eyes QHS   levothyroxine  137 mcg Oral QAC breakfast   methocarbamol  500 mg Oral BID   pantoprazole (PROTONIX) IV  40 mg Intravenous Q12H   polyethylene glycol-electrolytes  4,000 mL Oral Once   rosuvastatin  20 mg Oral Daily   Continuous Infusions:  sodium chloride 75 mL/hr at 05/17/22 1012   sodium chloride Stopped (05/18/22 1101)     LOS: 1 day    Time spent: 35 minutes    Barb Merino, MD [facility] Pager [phone]

## 2022-05-19 ENCOUNTER — Inpatient Hospital Stay: Payer: Medicare Other | Admitting: Anesthesiology

## 2022-05-19 ENCOUNTER — Encounter
Admission: EM | Disposition: A | Discharge status: Skilled Nursing Facility | Payer: Self-pay | Source: Skilled Nursing Facility | Attending: Internal Medicine

## 2022-05-19 ENCOUNTER — Encounter: Payer: Self-pay | Admitting: Internal Medicine

## 2022-05-19 ENCOUNTER — Inpatient Hospital Stay: Payer: Medicare Other

## 2022-05-19 ENCOUNTER — Other Ambulatory Visit (INDEPENDENT_AMBULATORY_CARE_PROVIDER_SITE_OTHER): Payer: Self-pay | Admitting: Nurse Practitioner

## 2022-05-19 DIAGNOSIS — D649 Anemia, unspecified: Secondary | ICD-10-CM | POA: Diagnosis not present

## 2022-05-19 DIAGNOSIS — K922 Gastrointestinal hemorrhage, unspecified: Secondary | ICD-10-CM | POA: Diagnosis not present

## 2022-05-19 HISTORY — PX: VISCERAL ARTERY INTERVENTION: CATH118277

## 2022-05-19 HISTORY — PX: COLONOSCOPY WITH PROPOFOL: SHX5780

## 2022-05-19 HISTORY — PX: ESOPHAGOGASTRODUODENOSCOPY (EGD) WITH PROPOFOL: SHX5813

## 2022-05-19 LAB — HEMOGLOBIN AND HEMATOCRIT, BLOOD
HCT: 19.5 % — ABNORMAL LOW (ref 39.0–52.0)
HCT: 22.9 % — ABNORMAL LOW (ref 39.0–52.0)
Hemoglobin: 6.8 g/dL — ABNORMAL LOW (ref 13.0–17.0)
Hemoglobin: 7.7 g/dL — ABNORMAL LOW (ref 13.0–17.0)

## 2022-05-19 LAB — GLUCOSE, CAPILLARY
Glucose-Capillary: 103 mg/dL — ABNORMAL HIGH (ref 70–99)
Glucose-Capillary: 100 mg/dL — ABNORMAL HIGH (ref 70–99)

## 2022-05-19 LAB — PREPARE RBC (CROSSMATCH)

## 2022-05-19 SURGERY — ESOPHAGOGASTRODUODENOSCOPY (EGD) WITH PROPOFOL
Anesthesia: General

## 2022-05-19 SURGERY — VISCERAL ARTERY INTERVENTION
Anesthesia: General

## 2022-05-19 MED ORDER — IOHEXOL 350 MG/ML SOLN
100.0000 mL | Freq: Once | INTRAVENOUS | Status: AC | PRN
  Administered 2022-05-19: 100 mL via INTRAVENOUS

## 2022-05-19 MED ORDER — IODIXANOL 320 MG/ML IV SOLN
INTRAVENOUS | Status: DC | PRN
  Administered 2022-05-19: 75 mL via INTRA_ARTERIAL

## 2022-05-19 MED ORDER — MIDAZOLAM HCL 2 MG/ML PO SYRP: 8.0000 mg | ORAL_SOLUTION | Freq: Once | ORAL | Status: DC | PRN

## 2022-05-19 MED ORDER — METHYLPREDNISOLONE SODIUM SUCC 125 MG IJ SOLR: 125.0000 mg | Freq: Once | INTRAMUSCULAR | Status: DC | PRN

## 2022-05-19 MED ORDER — PROPOFOL 10 MG/ML IV BOLUS
INTRAVENOUS | Status: DC | PRN
  Administered 2022-05-19: 70 mg via INTRAVENOUS

## 2022-05-19 MED ORDER — ONDANSETRON HCL 4 MG/2ML IJ SOLN
4.0000 mg | Freq: Four times a day (QID) | INTRAMUSCULAR | Status: DC | PRN
  Administered 2022-05-19: 4 mg via INTRAVENOUS
  Filled 2022-05-19: qty 2

## 2022-05-19 MED ORDER — LIDOCAINE HCL (CARDIAC) PF 100 MG/5ML IV SOSY
PREFILLED_SYRINGE | INTRAVENOUS | Status: DC | PRN
  Administered 2022-05-19: 60 mg via INTRAVENOUS

## 2022-05-19 MED ORDER — PROPOFOL 10 MG/ML IV BOLUS
INTRAVENOUS | Status: DC | PRN
  Administered 2022-05-19: 50 mg via INTRAVENOUS

## 2022-05-19 MED ORDER — SODIUM CHLORIDE 0.9 % IV SOLN: INTRAVENOUS | Status: DC

## 2022-05-19 MED ORDER — FENTANYL CITRATE (PF) 100 MCG/2ML IJ SOLN: 25.0000 ug | INTRAMUSCULAR | Status: DC | PRN

## 2022-05-19 MED ORDER — OXYCODONE HCL 5 MG PO TABS: 5.0000 mg | ORAL_TABLET | Freq: Once | ORAL | Status: DC | PRN

## 2022-05-19 MED ORDER — SODIUM CHLORIDE 0.9% IV SOLUTION: Freq: Once | INTRAVENOUS | Status: AC

## 2022-05-19 MED ORDER — SODIUM CHLORIDE 0.9% FLUSH
3.0000 mL | Freq: Two times a day (BID) | INTRAVENOUS | Status: DC
  Administered 2022-05-20 – 2022-05-22 (×4): 3 mL via INTRAVENOUS

## 2022-05-19 MED ORDER — HYDROMORPHONE HCL 1 MG/ML IJ SOLN: 1.0000 mg | Freq: Once | INTRAMUSCULAR | Status: DC | PRN

## 2022-05-19 MED ORDER — FAMOTIDINE 20 MG PO TABS: 40.0000 mg | ORAL_TABLET | Freq: Once | ORAL | Status: DC | PRN

## 2022-05-19 MED ORDER — PROPOFOL 1000 MG/100ML IV EMUL
INTRAVENOUS | Status: AC
  Filled 2022-05-19: qty 100

## 2022-05-19 MED ORDER — SODIUM CHLORIDE 0.9 % IV SOLN: 250.0000 mL | INTRAVENOUS | Status: DC | PRN

## 2022-05-19 MED ORDER — PHENYLEPHRINE 80 MCG/ML (10ML) SYRINGE FOR IV PUSH (FOR BLOOD PRESSURE SUPPORT)
PREFILLED_SYRINGE | INTRAVENOUS | Status: DC | PRN
  Administered 2022-05-19 (×7): 80 ug via INTRAVENOUS

## 2022-05-19 MED ORDER — EPHEDRINE SULFATE (PRESSORS) 50 MG/ML IJ SOLN
INTRAMUSCULAR | Status: DC | PRN
  Administered 2022-05-19 (×3): 5 mg via INTRAVENOUS

## 2022-05-19 MED ORDER — PROPOFOL 10 MG/ML IV BOLUS
INTRAVENOUS | Status: AC
  Filled 2022-05-19: qty 20

## 2022-05-19 MED ORDER — SODIUM CHLORIDE 0.9% FLUSH: 3.0000 mL | INTRAVENOUS | Status: DC | PRN

## 2022-05-19 MED ORDER — DIPHENHYDRAMINE HCL 50 MG/ML IJ SOLN: 50.0000 mg | Freq: Once | INTRAMUSCULAR | Status: DC | PRN

## 2022-05-19 MED ORDER — PHENYLEPHRINE 80 MCG/ML (10ML) SYRINGE FOR IV PUSH (FOR BLOOD PRESSURE SUPPORT)
PREFILLED_SYRINGE | INTRAVENOUS | Status: AC
  Filled 2022-05-19: qty 10

## 2022-05-19 MED ORDER — OXYCODONE HCL 5 MG PO TABS: 5.0000 mg | ORAL_TABLET | ORAL | Status: DC | PRN

## 2022-05-19 MED ORDER — PROPOFOL 500 MG/50ML IV EMUL
INTRAVENOUS | Status: DC | PRN
  Administered 2022-05-19: 140 ug/kg/min via INTRAVENOUS

## 2022-05-19 MED ORDER — OXYCODONE HCL 5 MG/5ML PO SOLN: 5.0000 mg | Freq: Once | ORAL | Status: DC | PRN

## 2022-05-19 MED ORDER — ONDANSETRON HCL 4 MG/2ML IJ SOLN: 4.0000 mg | Freq: Four times a day (QID) | INTRAMUSCULAR | Status: DC | PRN

## 2022-05-19 MED ORDER — CEFAZOLIN SODIUM-DEXTROSE 2-4 GM/100ML-% IV SOLN
INTRAVENOUS | Status: AC
  Filled 2022-05-19: qty 100

## 2022-05-19 MED ORDER — CEFAZOLIN SODIUM-DEXTROSE 2-4 GM/100ML-% IV SOLN
2.0000 g | INTRAVENOUS | Status: AC
  Administered 2022-05-19: 2 g via INTRAVENOUS

## 2022-05-19 MED ORDER — MORPHINE SULFATE (PF) 4 MG/ML IV SOLN: 2.0000 mg | INTRAVENOUS | Status: DC | PRN

## 2022-05-19 MED ORDER — DEXMEDETOMIDINE (PRECEDEX) IN NS 20 MCG/5ML (4 MCG/ML) IV SYRINGE
PREFILLED_SYRINGE | INTRAVENOUS | Status: DC | PRN
  Administered 2022-05-19 (×2): 8 ug via INTRAVENOUS

## 2022-05-19 MED ORDER — PROPOFOL 500 MG/50ML IV EMUL
INTRAVENOUS | Status: DC | PRN
  Administered 2022-05-19: 50 ug/kg/min via INTRAVENOUS

## 2022-05-19 MED ORDER — ACETAMINOPHEN 325 MG PO TABS: 650.0000 mg | ORAL_TABLET | ORAL | Status: DC | PRN

## 2022-05-19 SURGICAL SUPPLY — 17 items
CANNULA 5F STIFF (CANNULA) ×1 IMPLANT
CATH ANGIO 5F PIGTAIL 65CM (CATHETERS) ×1 IMPLANT
CATH MICROCATH PRGRT 2.8F 110 (CATHETERS) IMPLANT
CATH VS15FR (CATHETERS) ×1 IMPLANT
COVER PROBE U/S 5X48 (MISCELLANEOUS) ×1 IMPLANT
DEVICE STARCLOSE SE CLOSURE (Vascular Products) ×1 IMPLANT
GLIDEWIRE ANGLED SS 035X260CM (WIRE) ×1 IMPLANT
GUIDEWIRE ADV .018X180CM (WIRE) ×1 IMPLANT
GUIDEWIRE ANGLED .035 180CM (WIRE) ×1 IMPLANT
MICROCATH PROGREAT 2.8F 110 CM (CATHETERS) ×2
PACK ANGIOGRAPHY (CUSTOM PROCEDURE TRAY) ×1 IMPLANT
SHEATH BRITE TIP 5FRX11 (SHEATH) ×1 IMPLANT
SYR EMBOSPHERE 500-700 2ML (Embolic) ×2 IMPLANT
SYR MEDRAD MARK 7 150ML (SYRINGE) ×1 IMPLANT
SYRINGE EMBOSPHERE 500-700 2ML (Embolic) IMPLANT
TUBING CONTRAST HIGH PRESS 72 (TUBING) ×1 IMPLANT
WIRE GUIDERIGHT .035X150 (WIRE) ×1 IMPLANT

## 2022-05-19 NOTE — Progress Notes (Signed)
Patient has scheduled EGD and Colonoscopy this AM. Latest BM around 0400, liquid w/out solids and reddish brown in color. Patient finished 3/4 of bowel prep and refused to drink more.

## 2022-05-19 NOTE — Anesthesia Preprocedure Evaluation (Signed)
Anesthesia Evaluation  Patient identified by MRN, date of birth, ID band Patient awake    Reviewed: Allergy & Precautions, H&P , NPO status , Patient's Chart, lab work & pertinent test results, reviewed documented beta blocker date and time   History of Anesthesia Complications Negative for: history of anesthetic complications  Airway Mallampati: II  TM Distance: >3 FB Neck ROM: full    Dental  (+) Chipped   Pulmonary neg pulmonary ROS, neg shortness of breath,    Pulmonary exam normal        Cardiovascular Exercise Tolerance: Good hypertension, (-) anginaNormal cardiovascular exam     Neuro/Psych PSYCHIATRIC DISORDERS Depression negative neurological ROS     GI/Hepatic Neg liver ROS, GERD  ,  Endo/Other  neg diabetesHypothyroidism   Renal/GU negative Renal ROS  negative genitourinary   Musculoskeletal   Abdominal   Peds  Hematology  (+) Blood dyscrasia, anemia ,   Anesthesia Other Findings Past Medical History: No date: Asymptomatic Sinus bradycardia No date: Hyperlipidemia No date: Hypothyroidism No date: Moderate mitral regurgitation     Comment:  a. 09/2018 Echo: Mod MR. No date: Orthostasis No date: Syncope     Comment:  a. 09/2018 syncope in the setting of wine/empty               stomach/sildenafil/orthostasis; b. 09/2018 Echo: EF               55-60%, no rwma, mild AI, mod MR, mildly dil RV/RA. PASP               95mHg; c. 09/2018 Carotid U/S: <39% bilat ICA dzs.   Reproductive/Obstetrics negative OB ROS                             Anesthesia Physical  Anesthesia Plan  ASA: 3  Anesthesia Plan: General   Post-op Pain Management:    Induction: Intravenous  PONV Risk Score and Plan: TIVA  Airway Management Planned: Natural Airway and Nasal Cannula  Additional Equipment:   Intra-op Plan:   Post-operative Plan:   Informed Consent: I have reviewed the patients  History and Physical, chart, labs and discussed the procedure including the risks, benefits and alternatives for the proposed anesthesia with the patient or authorized representative who has indicated his/her understanding and acceptance.     Dental Advisory Given  Plan Discussed with: Anesthesiologist, CRNA and Surgeon  Anesthesia Plan Comments: (Patient consented for risks of anesthesia including but not limited to:  - adverse reactions to medications - risk of airway placement if required - damage to eyes, teeth, lips or other oral mucosa - nerve damage due to positioning  - sore throat or hoarseness - Damage to heart, brain, nerves, lungs, other parts of body or loss of life  Patient voiced understanding.)        Anesthesia Quick Evaluation

## 2022-05-19 NOTE — Progress Notes (Signed)
Pt to bedside toilet with standby assist, bloody bowel movement with frank red blood apparent approx 665ms output possibly mixed with some loose stool but not apparent.   V/S afterwards WNL, pt mentation good and alert&oriented x 4.

## 2022-05-19 NOTE — H&P (Signed)
Brett Bellows, MD [address] [place], [place], [place], [address] [id number], [place], [place], [place], [address] Phone: [phone]  Fax: [phone]  Primary Care Physician:  Derinda Late, MD   Pre-Procedure History & Physical: HPI:  Brett Ray is a 88 y.o. male is here for an endoscopy and colonoscopy    Past Medical History:  Diagnosis Date   Asymptomatic Sinus bradycardia    Hyperlipidemia    Hypothyroidism    Moderate mitral regurgitation    a. 09/2018 Echo: Mod MR.   Orthostasis    Syncope    a. 09/2018 syncope in the setting of wine/empty stomach/sildenafil/orthostasis; b. 09/2018 Echo: EF 55-60%, no rwma, mild AI, mod MR, mildly dil RV/RA. PASP 53mHg; c. 09/2018 Carotid U/S: <39% bilat ICA dzs.    Past Surgical History:  Procedure Laterality Date   APPENDECTOMY     SHOULDER ARTHROSCOPY      Prior to Admission medications   Medication Sig Start Date End Date Taking? Authorizing Provider  citalopram (CELEXA) 10 MG tablet Take 1 tablet by mouth daily. 08/28/18  Yes [provider]  finasteride (PROSCAR) 5 MG tablet Take 5 mg by mouth daily.   Yes [provider]  latanoprost (XALATAN) 0.005 % ophthalmic solution 1 drop at bedtime. 05/19/21  Yes [provider]  levothyroxine (SYNTHROID) 137 MCG tablet Take 137 mcg by mouth daily before breakfast. 03/08/22  Yes [provider]  meloxicam (MOBIC) 15 MG tablet Take 1 tablet by mouth daily. 07/18/18  Yes [provider]  methocarbamol (ROBAXIN) 500 MG tablet Take 500 mg by mouth 2 (two) times daily. 05/05/22  Yes [provider]  omeprazole (PRILOSEC) 20 MG capsule Take 20 mg by mouth daily. 03/04/22  Yes [provider]  Probiotic Product (PROBIOTIC DAILY PO) Take by mouth daily.   Yes [provider]  rosuvastatin (CRESTOR) 20 MG tablet TAKE 1 TABLET(20 MG) BY MOUTH AT BEDTIME 04/27/22  Yes Gollan, TKathlene November MD  vitamin C (ASCORBIC  ACID) 500 MG tablet Take 500 mg by mouth daily.   Yes [provider]  aspirin EC 81 MG tablet Take 81 mg by mouth daily. Patient not taking: Reported on 06/16/2021    [provider]  bimatoprost (LUMIGAN) 0.03 % ophthalmic solution Place 1 drop into both eyes at bedtime. Patient not taking: Reported on 06/16/2021    [provider]  bimatoprost (LUMIGAN) 0.03 % ophthalmic solution Place 1 drop into both eyes in the morning and at bedtime. Patient not taking: Reported on 06/16/2021    [provider]  calcium carbonate (TUMS - DOSED IN MG ELEMENTAL CALCIUM) 500 MG chewable tablet Chew 1 tablet by mouth 2 (two) times daily as needed for indigestion or heartburn.    [provider]  fluticasone (VERAMYST) 27.5 MCG/SPRAY nasal spray Place 2 sprays into the nose daily.    [provider]  hydrALAZINE (APRESOLINE) 25 MG tablet TAKE 1 TABLET BY MOUTH THREE TIMES DAILY AS NEEDED( PRESSURE> 170) 03/14/22   Gollan, TKathlene November MD  HYDROcodone-acetaminophen (NORCO/VICODIN) 5-325 MG tablet Norco 5 mg-325 mg tablet  1 PO q 6 hrs prn pain Patient not taking: Reported on 05/17/2022    [provider]  levothyroxine (SYNTHROID, LEVOTHROID) 125 MCG tablet Take 125 mcg by mouth daily before breakfast. Patient not taking: Reported on 05/17/2022    [provider]  LUMIGAN 0.01 % SOLN INT 1 GTT IN OU QPM Patient not taking: Reported on  06/16/2021 07/27/19   [provider]  polyethylene glycol (MIRALAX / GLYCOLAX) packet Take 17 g by mouth daily.    [provider]    Allergies as of 05/17/2022   (No Known Allergies)    History reviewed. No pertinent family history.  Social History   Socioeconomic History   Marital status: Married    Spouse name: Not on file   Number of children: Not on file   Years of education: Not on file   Highest education level: Not on file  Occupational History   Not on file  Tobacco Use   Smoking  status: Never   Smokeless tobacco: Never  Vaping Use   Vaping Use: Never used  Substance and Sexual Activity   Alcohol use: Yes    Alcohol/week: 2.0 standard drinks of alcohol    Types: 2 Glasses of wine per week    Comment: every evening   Drug use: Never   Sexual activity: Yes  Other Topics Concern   Not on file  Social History Narrative   Not on file   Social Determinants of Health   Financial Resource Strain: Not on file  Food Insecurity: Not on file  Transportation Needs: Not on file  Physical Activity: Not on file  Stress: Not on file  Social Connections: Not on file  Intimate Partner Violence: Not on file    Review of Systems: See HPI, otherwise negative ROS  Physical Exam: BP (!) 180/59   Pulse (!) 54   Temp (!) 96.9 F (36.1 C) (Temporal)   Resp 18   Ht '5\' 9"'$  (1.753 m)   Wt 70.8 kg   SpO2 95%   BMI 23.04 kg/m  General:   Alert,  pleasant and cooperative in NAD Head:  Normocephalic and atraumatic. Neck:  Supple; no masses or thyromegaly. Lungs:  Clear throughout to auscultation, normal respiratory effort.    Heart:  +S1, +S2, Regular rate and rhythm, No edema. Abdomen:  Soft, nontender and nondistended. Normal bowel sounds, without guarding, and without rebound.   Neurologic:  Alert and  oriented x4;  grossly normal neurologically.  Impression/Plan: Brett Ray is here for an endoscopy and colonoscopy  to be performed for  evaluation of iron def anemia     Risks, benefits, limitations, and alternatives regarding endoscopy have been reviewed with the patient.  Questions have been answered.  All parties agreeable.   Brett Bellows, MD  05/19/2022, 10:19 AM

## 2022-05-19 NOTE — Op Note (Signed)
[facility]  Percutaneous Study/Intervention Procedural Note     Surgeon(s): Mudlogger: none  Pre-operative Diagnosis: 1.  GI bleed likely terminal ileum ileocecal distribution 2.  Anemia blood loss requiring transfusion  Post-operative diagnosis:  Same  Procedure(s) Performed:             1.  Ultrasound guidance for vascular access right femoral artery             2.  Catheter placement into ileocolic artery             3.  Aortogram and selective angiogram of the superior mesenteric artery and the ileocolic distal vasculature             4.  Microbead embolization of the ileocolic artery with 1.5 cc of 500-700  polyvinyl alcohol beads.             5.  StarClose closure device right femoral artery  Anesthesia: MAC         EBL: 10 cc  Fluoro Time: 12 minutes and 25 seconds  Contrast: 75 cc              Indications:  Patient is a 88 y.o.male with brisk lower GI bleeding with resultant anemia. The patient has a CT study showing likely region of bleeding is in the terminal ileum.  Colonoscopy performed earlier today is also suggestive of this distribution.  The patient is brought in for angiography for further evaluation and potential treatment. Risks and benefits are discussed and informed consent is obtained  Procedure:  The patient was identified and appropriate procedural time out was performed.  The patient was then placed supine on the table and prepped and draped in the usual sterile fashion.   Ultrasound was used to evaluate the right common femoral artery.  It was patent .  A digital ultrasound image was acquired.  A micropuncture needle was used to access the right common femoral artery under direct ultrasound guidance and a permanent image was performed.  Microwire was then advanced under fluoroscopic guidance.  A microsheath was then advanced without difficulty followed by an 0.035 J wire was advanced without resistance and a 5Fr  sheath was placed.  Pigtail catheter was placed into the aorta and an AP aortogram was performed. This demonstrated the approximate location of the visceral arteries.  We transitioned to the lateral projection to cannulate the superior mesenteric artery. A V S1 catheter was used to selectively cannulate the superior mesenteric artery.  Using a combination of a 0.018 advantage wire as well as a prograde catheter I was able to negotiate the V S1 catheter into the superior mesenteric artery approximately half the way distally.  At this point injection through the V S1 catheter gave imaging that clearly demonstrated the ileocolic artery.  Using the prograde catheter as well as a floppy Glidewire was able to negotiate the prograde down to the proximal ileocolic artery and perform a magnified image.  This demonstrated a blush that was consistent with the terminal ileum. Based on his continued bleeding and the CT/colonoscopy data  I elected to treat this area with embolization.  Using the pro-Great microcatheter I instilled approximately 1.5 cc of medium 500 to 700 m beads in this location. Angiogram following this showed the main vessels to be open with less brisk filling.  The previously noted blush could no longer be identified.  I elected to terminate the procedure. The diagnostic catheter was removed. StarClose  closure device was deployed in usual fashion with excellent hemostatic result. The patient was taken to the recovery room in stable condition having tolerated the procedure well.     Findings: Imaging of the superior mesenteric artery demonstrated wide patency of the SMA and its branches.  I was able to negotiate the catheter down into the ileocolic where a magnified image did appear to show a blush consistent with the findings from other studies.  Medium sized PVC beads were instilled as described above.  Disposition: Patient was taken to the recovery room in stable condition having tolerated the  procedure well.  Complications:  None  Hortencia Pilar 05/19/2022 10:08 PM   This note was created with Dragon Medical transcription system. Any errors in dictation are purely unintentional.

## 2022-05-19 NOTE — Op Note (Signed)
[facility] Gastroenterology Patient Name: Brett Ray Procedure Date: 05/19/2022 10:23 AM MRN: [id number] Account #: [id number] Date of Birth: 08-04-1931 Admit Type: Inpatient Age: 88 Room: [facility] ENDO ROOM 3 Gender: Male Note Status: Finalized Instrument Name: Upper Endoscope [id number] Procedure:             Upper GI endoscopy Indications:           Iron deficiency anemia Providers:             [name] MD, MD Referring MD:          Caprice Renshaw MD (Referring MD) Medicines:             Monitored Anesthesia Care Complications:         No immediate complications. Procedure:             Pre-Anesthesia Assessment:                        - Prior to the procedure, a History and Physical was                         performed, and patient medications, allergies and                         sensitivities were reviewed. The patient's tolerance                         of previous anesthesia was reviewed.                        - The risks and benefits of the procedure and the                         sedation options and risks were discussed with the                         patient. All questions were answered and informed                         consent was obtained.                        - ASA Grade Assessment: II - A patient with mild                         systemic disease.                        After obtaining informed consent, the endoscope was                         passed under direct vision. Throughout the procedure,                         the patient's blood pressure, pulse, and oxygen                         saturations were monitored continuously. The Endoscope  was introduced through the mouth, and advanced to the                         third part of duodenum. The upper GI endoscopy was                         accomplished with ease. The patient tolerated the                         procedure well. Findings:      LA Grade A  (one or more mucosal breaks less than 5 mm, not extending       between tops of 2 mucosal folds) esophagitis with no bleeding was found       at the gastroesophageal junction.      A mild Schatzki ring was found at the gastroesophageal junction.      A medium-sized hiatal hernia was present.      The cardia and gastric fundus were normal on retroflexion.      The stomach was normal.      The examined duodenum was normal. Impression:            - LA Grade A reflux esophagitis with no bleeding.                        - Mild Schatzki ring.                        - Medium-sized hiatal hernia.                        - Normal stomach.                        - Normal examined duodenum.                        - No specimens collected. Recommendation:        - Perform a colonoscopy today.                        - Prilosec 40 mg a day for 3 months and then consider                         repeat EGD to check for healing of esophagitis. Procedure Code(s):     --- Professional ---                        [phone], Esophagogastroduodenoscopy, flexible,                         transoral; diagnostic, including collection of                         specimen(s) by brushing or washing, when performed                         (separate procedure) Diagnosis Code(s):     --- Professional ---                        K21.00, Gastro-esophageal reflux disease with  esophagitis, without bleeding                        K22.2, Esophageal obstruction                        K44.9, Diaphragmatic hernia without obstruction or                         gangrene                        D50.9, Iron deficiency anemia, unspecified CPT copyright 2019 American Medical Association. All rights reserved. The codes documented in this report are preliminary and upon coder review may  be revised to meet current compliance requirements. [name], MD [name] MD, MD 05/19/2022 10:46:19 AM This report has been  signed electronically. Number of Addenda: 0 Note Initiated On: 05/19/2022 10:23 AM Estimated Blood Loss:  Estimated blood loss: none.      [facility]

## 2022-05-19 NOTE — Consult Note (Signed)
[place] [facility], [facility]   MRN : [id number]  Brett Ray is a 88 y.o. ([birth year]) male who presents with chief complaint of GI bleed.  History of Present Illness:   I am asked to evaluate the patient by Dr. Vicente Ray.  Patient is an 88 year old gentleman who presented to [facility] 2 days ago after several bouts of nonbloody diarrhea.  The diarrhea was acute in onset and is atypical for the patient.  He denied nausea vomiting or fever or chills.  He denies abdominal pain.  There is no history of chronic GI issues or diverticulosis.  On evaluation in the ER he was noted to have heme positive stools.  Subsequently the patient was noted to have blood per rectum.  A CT angiogram of the abdomen and pelvis was performed which initially was thought to show bleeding from the hepatic flexure but on secondary evaluation is consistent with bleeding from the terminal ileum and the cecal area.  Dr. Vicente Ray from GI was consulted and colonoscopy earlier today which showed bright red blood and clots at the ileocecal valve but the colon was otherwise normal.  The patient has been bleeding significantly and is required blood transfusions.  At this point I am asked to evaluate for possible embolization. Current Meds  Medication Sig   citalopram (CELEXA) 10 MG tablet Take 1 tablet by mouth daily.   finasteride (PROSCAR) 5 MG tablet Take 5 mg by mouth daily.   latanoprost (XALATAN) 0.005 % ophthalmic solution 1 drop at bedtime.   levothyroxine (SYNTHROID) 137 MCG tablet Take 137 mcg by mouth daily before breakfast.   meloxicam (MOBIC) 15 MG tablet Take 1 tablet by mouth daily.   methocarbamol (ROBAXIN) 500 MG tablet Take 500 mg by mouth 2 (two) times daily.   omeprazole (PRILOSEC) 20 MG capsule Take 20 mg by mouth daily.   Probiotic Product (PROBIOTIC DAILY PO) Take by mouth daily.   rosuvastatin (CRESTOR) 20 MG tablet TAKE 1 TABLET(20 MG) BY MOUTH AT  BEDTIME   vitamin C (ASCORBIC ACID) 500 MG tablet Take 500 mg by mouth daily.    Past Medical History:  Diagnosis Date   Asymptomatic Sinus bradycardia    Hyperlipidemia    Hypothyroidism    Moderate mitral regurgitation    a. 09/2018 Echo: Mod MR.   Orthostasis    Syncope    a. 09/2018 syncope in the setting of wine/empty stomach/sildenafil/orthostasis; b. 09/2018 Echo: EF 55-60%, no rwma, mild AI, mod MR, mildly dil RV/RA. PASP 63mHg; c. 09/2018 Carotid U/S: <39% bilat ICA dzs.    Past Surgical History:  Procedure Laterality Date   APPENDECTOMY     SHOULDER ARTHROSCOPY      Social History Social History   Tobacco Use   Smoking status: Never   Smokeless tobacco: Never  Vaping Use   Vaping Use: Never used  Substance Use Topics   Alcohol use: Yes    Alcohol/week: 2.0 standard drinks of alcohol    Types: 2 Glasses of wine per week    Comment: every evening   Drug use: Never    Family History History reviewed. No pertinent family history.  No Known Allergies   REVIEW OF SYSTEMS (Negative unless checked)  Constitutional: '[]'$ Weight loss  '[]'$ Fever  '[]'$ Chills Cardiac: '[]'$ Chest pain   '[]'$ Chest pressure   '[]'$ Palpitations   '[]'$ Shortness of breath when laying flat   '[]'$ Shortness of breath with  exertion. Vascular:  '[x]'$ Pain in legs with walking   '[]'$ Pain in legs at rest  '[]'$ History of DVT   '[]'$ Phlebitis   '[]'$ Swelling in legs   '[]'$ Varicose veins   '[]'$ Non-healing ulcers Pulmonary:   '[]'$ Uses home oxygen   '[]'$ Productive cough   '[]'$ Hemoptysis   '[]'$ Wheeze  '[]'$ COPD   '[]'$ Asthma Neurologic:  '[]'$ Dizziness   '[]'$ Seizures   '[]'$ History of stroke   '[]'$ History of TIA  '[]'$ Aphasia   '[]'$ Vissual changes   '[]'$ Weakness or numbness in arm   '[]'$ Weakness or numbness in leg Musculoskeletal:   '[]'$ Joint swelling   '[]'$ Joint pain   '[]'$ Low back pain Hematologic:  '[]'$ Easy bruising  '[]'$ Easy bleeding   '[]'$ Hypercoagulable state   '[]'$ Anemic Gastrointestinal:  '[]'$ Diarrhea   '[]'$ Vomiting  '[]'$ Gastroesophageal reflux/heartburn   '[]'$ Difficulty  swallowing. Genitourinary:  '[]'$ Chronic kidney disease   '[]'$ Difficult urination  '[]'$ Frequent urination   '[]'$ Blood in urine Skin:  '[]'$ Rashes   '[]'$ Ulcers  Psychological:  '[]'$ History of anxiety   '[]'$  History of major depression.  Physical Examination  Vitals:   05/19/22 1915 05/19/22 1930 05/19/22 1945 05/19/22 2000  BP: (!) 180/62     Pulse: 61 64 65 64  Resp: '17 13 14 15  '$ Temp:      TempSrc:      SpO2: 97% 96% 96% 97%  Weight:      Height:       Body mass index is 23.04 kg/m. Gen: WD/WN, NAD Head: [place]/AT, No temporalis wasting.  Ear/Nose/Throat: Hearing grossly intact, nares w/o erythema or drainage Eyes: PER, EOMI, sclera nonicteric.  Neck: Supple, no masses.  No bruit or JVD.  Pulmonary:  Good air movement, no audible wheezing, no use of accessory muscles.  Cardiac: RRR, normal S1, S2, no Murmurs. Vascular:   Vessel Right Left  Radial Palpable Palpable  Gastrointestinal: soft, non-distended. No guarding/no peritoneal signs.  Musculoskeletal: M/S 5/5 throughout.  No visible deformity.  Neurologic: CN 2-12 intact. Pain and light touch intact in extremities.  Symmetrical.  Speech is fluent. Motor exam as listed above. Psychiatric: Judgment intact, Mood & affect appropriate for pt's clinical situation. Dermatologic: No rashes or ulcers noted.  No changes consistent with cellulitis.   CBC Lab Results  Component Value Date   WBC 6.3 05/18/2022   HGB 6.8 (L) 05/19/2022   HCT 19.5 (L) 05/19/2022   MCV 88.5 05/18/2022   PLT 145 (L) 05/18/2022    BMET    Component Value Date/Time   NA 131 (L) 05/17/2022 0649   K 4.1 05/17/2022 0649   CL 101 05/17/2022 0649   CO2 22 05/17/2022 0649   GLUCOSE 154 (H) 05/17/2022 0649   BUN 25 (H) 05/17/2022 0649   CREATININE 0.83 05/17/2022 0649   CALCIUM 8.4 (L) 05/17/2022 0649   GFRNONAA >60 05/17/2022 0649   GFRAA >60 09/22/2018 2023   Estimated Creatinine Clearance: 60.3 mL/min (by C-G formula based on SCr of 0.83 mg/dL).  COAG Lab  Results  Component Value Date   INR 1.2 05/17/2022    Radiology DG C-Arm 1-60 Min-No Report  Result Date: 05/19/2022 Fluoroscopy was utilized by the requesting physician.  No radiographic interpretation.   PERIPHERAL VASCULAR CATHETERIZATION  Result Date: 05/19/2022 See surgical note for result.  CT Angio Abd/Pel w/ and/or w/o  Addendum Date: 05/19/2022   ADDENDUM REPORT: 05/19/2022 14:13 ADDENDUM: Case was discussed with GI team, with no colonic hemorrhage identified on colonoscopy, and positive findings of hematochezia of terminal ileum. FINDINGS: After review, the puddling/pooling of contrast is within loops of small  bowel at the distal ileum/terminal ileum, however the terminal ileum loops are located right upper quadrant between liver and the abdominal wall. IMPRESSION: After secondary review, CT angiogram is positive for evidence of acute GI hemorrhage, however not within hepatic flexure, but instead within distal ileal loops/terminal ileum. Distal small bowel loops are transposed into the right upper quadrant, superficial to the liver. Electronically Signed   By: Corrie Mckusick D.O.   On: 05/19/2022 14:13   Result Date: 05/19/2022 CLINICAL DATA:  88 year old male with GI bleeding EXAM: CTA ABDOMEN AND PELVIS WITHOUT AND WITH CONTRAST TECHNIQUE: Multidetector CT imaging of the abdomen and pelvis was performed using the standard protocol during bolus administration of intravenous contrast. Multiplanar reconstructed images and MIPs were obtained and reviewed to evaluate the vascular anatomy. RADIATION DOSE REDUCTION: This exam was performed according to the departmental dose-optimization program which includes automated exposure control, adjustment of the mA and/or kV according to patient size and/or use of iterative reconstruction technique. CONTRAST:  144m OMNIPAQUE IOHEXOL 350 MG/ML SOLN COMPARISON:  07/16/2021, 05/17/2022 FINDINGS: VASCULAR Aorta: Atherosclerotic changes of the abdominal aorta.  Tortuosity. No no wall thickening or inflammatory changes. Irregular plaque/chronic ulcerated plaque along the right aspect of the aorta, just inferior to the right renal artery takeoff. This results in maximum diameter of the aorta on the coronal images 25 mm. Celiac: Atherosclerotic changes without high-grade stenosis SMA: Atherosclerotic changes without high-grade stenosis Renals: - Right: Right renal artery patent. - Left: Atherosclerotic changes at the origin of the left renal artery, estimated 50% narrowing. IMA: Inferior mesenteric artery is patent. Right lower extremity: Tortuosity of the right iliac system. No aneurysm, dissection, or occlusion. Mild atherosclerosis. Hypogastric artery is patent. Common femoral artery patent. Proximal SFA and profunda femoris patent. Left lower extremity: Tortuosity of the left iliac system. No aneurysm, dissection, or occlusion. Hypogastric artery is patent. Mild atherosclerosis. Common femoral artery patent. Proximal SFA and profunda femoris patent. Veins: Unremarkable appearance of the venous system. Review of the MIP images confirms the above findings. NON-VASCULAR Lower chest: No acute. Hepatobiliary: Unremarkable appearance of the liver. High density material within the gallbladder, potentially sludge/microlithiasis. Pancreas: Rounded low-density lesion within the pancreatic head associated with the ducts near the ampulla. No inflammatory changes. Spleen: Punctate calcifications, suggesting prior granulomatous disease. Adrenals/Urinary Tract: - Right adrenal gland: Unremarkable - Left adrenal gland: Unremarkable. - Right kidney: No hydronephrosis, nephrolithiasis, inflammation, or ureteral dilation. No focal lesion. - Left Kidney: No hydronephrosis, nephrolithiasis, inflammation, or ureteral dilation. No focal lesion. - Urinary Bladder: Urinary bladder partially distended. Diverticulum at the bladder base. Stomach/Bowel: - Stomach: Small hiatal hernia.  Otherwise  unremarkable stomach. - Small bowel: Small bowel demonstrates no transition point, with mild gaseous distension compared to the prior. No extravasation of contrast or puddling of contrast. No wall thickening. Wall enhancement appears within normal limits. - Appendix: Appendix is not visualized, however, no inflammatory changes are present adjacent to the cecum to indicate an appendicitis. - Colon: Fluid-filled colon. Puddling of contrast present at the hepatic flexure. Questionable wall thickening of the ascending colon/hepatic flexure at this site persists. Left-sided diverticula again noted. Lymphatic: No adenopathy. Mesenteric: No free fluid or air. No mesenteric adenopathy. Reproductive: Transverse diameter of the prostate 34 mm. Right-sided hydrocele. Other: Bilateral fat containing inguinal hernia. Musculoskeletal: Degenerative changes of the spine. No displaced fracture. No significant bony canal narrowing. No aggressive lytic or sclerotic lesions. IMPRESSION: CT angiogram positive for evidence of acute GI hemorrhage, with accumulation of extravasated contrast at the hepatic  flexure. Associated wall thickening, suggesting acute inflammation/colitis. These above results will be called to the ordering clinician or representative by the Radiologist Assistant, and communication documented in the PACS or [employer]. 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 Aortic Atherosclerosis (ICD10-I70.0). Additional ancillary findings as above. Signed, Dulcy Fanny. Nadene Rubins, RPVI [facility] Radiology Electronically Signed: By: Corrie Mckusick D.O. On: 05/19/2022 12:49   CT Angio Abd/Pel W and/or Wo Contrast  Result Date: 05/17/2022 CLINICAL DATA:  Diarrhea, GI bleed. EXAM: CTA ABDOMEN AND PELVIS WITHOUT AND WITH CONTRAST TECHNIQUE: Multidetector CT imaging of the abdomen and pelvis was performed using the standard protocol during bolus  administration of intravenous contrast. Multiplanar reconstructed images and MIPs were obtained and reviewed to evaluate the vascular anatomy. RADIATION DOSE REDUCTION: This exam was performed according to the departmental dose-optimization program which includes automated exposure control, adjustment of the mA and/or kV according to patient size and/or use of iterative reconstruction technique. CONTRAST:  20m OMNIPAQUE IOHEXOL 350 MG/ML SOLN COMPARISON:  CT abdomen dated 07/16/2021 FINDINGS: VASCULAR Aorta: Extensive aortic atherosclerosis, including ulcerated plaque of the ectatic infrarenal abdominal aorta. No aortic aneurysm or evidence of acute dissection. No significant stenosis. Celiac: Patent without evidence of aneurysm, dissection, vasculitis or significant stenosis. SMA: Patent without evidence of aneurysm, dissection, vasculitis or significant stenosis. Atherosclerosis at the origin and at the proximal branching of the SMA, but normal contrast flow is seen into the more distal branches without evidence of a hemodynamically significant stenosis. Renals: Atherosclerosis at the bilateral ostia, but normal flow into the renal arteries and distal branches without evidence of a hemodynamically significant stenosis. IMA: Patent without evidence of aneurysm, dissection, vasculitis or significant stenosis. Inflow: Patent without evidence of aneurysm, dissection, vasculitis or significant stenosis. Proximal Outflow: Bilateral common femoral and visualized portions of the superficial and profunda femoral arteries are patent without evidence of aneurysm, dissection, vasculitis or significant stenosis. Veins: No obvious venous abnormality within the limitations of this arterial phase study. Review of the MIP images confirms the above findings. NON-VASCULAR Lower chest: No acute abnormality. Hepatobiliary: No focal liver abnormality is seen. No gallstones, gallbladder wall thickening, or biliary dilatation. Pancreas:  Unremarkable. No pancreatic ductal dilatation or surrounding inflammatory changes. Spleen: Normal in size without focal abnormality. Adrenals/Urinary Tract: Adrenal glands are unremarkable. Kidneys appear normal without mass, stone or hydronephrosis. No perinephric fluid. No ureteral or bladder calculi are identified. Bladder is unremarkable. Stomach/Bowel: No dilated large or small bowel loops. Mild thickening of the walls of the ascending colon, with subtle pericolonic inflammation/fluid stranding. Appendix is not seen but there are no focal inflammatory changes about the cecum to suggest acute appendicitis. No active hemorrhage is identified within the large or small bowel. Scattered diverticulosis within the sigmoid colon but no focal inflammatory change to suggest acute diverticulitis. Stomach is unremarkable, partially decompressed. Lymphatic: No enlarged lymph nodes are seen. Reproductive: Prostate is unremarkable. Other: Trace free fluid in the lower pelvis. No additional free fluid is seen. No abscess collection. No free intraperitoneal air. Musculoskeletal: No acute-appearing osseous abnormality. Degenerative spondylosis of the slightly scoliotic thoracolumbar spine, mild to moderate in degree. IMPRESSION: 1. No active hemorrhage is identified within the large or small bowel. 2. Mild thickening of the walls of the ascending colon, with subtle pericolonic inflammation/fluid stranding, suspicious for a mild colitis of infectious or inflammatory nature. 3. Colonic diverticulosis without evidence of acute diverticulitis. Aortic Atherosclerosis (ICD10-I70.0). Electronically Signed   By: SRoxy HorsemanD.  On: 05/17/2022 09:26     Assessment/Plan GI bleed: The patient has evidence for persistent GI bleed and review of both the CT scan as well as the colonoscopy and in discussion with Dr. Vicente Ray it appears the most likely source is the terminal ileum.  Given this information I have discussed with the patient  embolization as a method of stopping the bleeding.  I have reviewed the risks and benefits with him as well as the procedure itself.  All of his questions were answered and he agreed to proceed with angiography and embolization which we will arrange for this evening given his persistent bleeding.  Post procedure we will monitor his white count as well as his hemoglobin.  2.  Anemia of blood loss secondary to his GI bleed: Patient has already received blood transfusion.  We will continue to monitor his H&H and further transfusions will be based on his hemodynamics as well as his hemoglobin/hematocrit.  3.  Hypothyroidism: Continue hormone replacement as ordered and reviewed, no changes at this time.  4.   Hyperlipidemia: Continue statin as ordered and reviewed, no changes at this time    Hortencia Pilar, MD  05/19/2022 9:51 PM

## 2022-05-19 NOTE — Progress Notes (Signed)
Discussed the CT angiogram findings with radiologist.  Explained that the colon was very tortuous and I did not see any bleeding at the hepatic flexure during colonoscopy earlier today.  I did see fresh blood coming out of the terminal ileum and in the terminal ileum.  He had a second look at the images and confirmed that the bleeding is likely from the distal small bowel and not from the hepatic flexure  Dr [name] MD,MRCP [facility]) Gastroenterology/Hepatology Pager: [phone]

## 2022-05-19 NOTE — Anesthesia Postprocedure Evaluation (Signed)
Anesthesia Post Note  Patient: Brett Ray  Procedure(s) Performed: VISCERAL ARTERY INTERVENTION  Patient location during evaluation: PACU Anesthesia Type: General Level of consciousness: awake and alert Pain management: pain level controlled Vital Signs Assessment: post-procedure vital signs reviewed and stable Respiratory status: spontaneous breathing, nonlabored ventilation, respiratory function stable and patient connected to nasal cannula oxygen Cardiovascular status: blood pressure returned to baseline and stable Postop Assessment: no apparent nausea or vomiting Anesthetic complications: no   No notable events documented.   Last Vitals:  Vitals:   05/19/22 2215 05/19/22 2230  BP: (!) 150/66 (!) 163/80  Pulse: 68 68  Resp: 20 15  Temp:    SpO2: 97% 96%    Last Pain:  Vitals:   05/19/22 2230  TempSrc:   PainSc: 0-No pain                 Precious Haws [name]

## 2022-05-19 NOTE — Transfer of Care (Signed)
Immediate Anesthesia Transfer of Care Note  Patient: Brett Ray  Procedure(s) Performed: ESOPHAGOGASTRODUODENOSCOPY (EGD) WITH PROPOFOL COLONOSCOPY WITH PROPOFOL  Patient Location: PACU  Anesthesia Type:General  Level of Consciousness: awake, alert  and oriented  Airway & Oxygen Therapy: Patient Spontanous Breathing  Post-op Assessment: Report given to RN and Post -op Vital signs reviewed and stable  Post vital signs: Reviewed and stable  Last Vitals:  Vitals Value Taken Time  BP 119/67 05/19/22 1132  Temp    Pulse 61 05/19/22 1136  Resp 14 05/19/22 1136  SpO2 97 % 05/19/22 1136  Vitals shown include unvalidated device data.  Last Pain:  Vitals:   05/19/22 1132  TempSrc:   PainSc: 0-No pain         Complications: No notable events documented.

## 2022-05-19 NOTE — Op Note (Signed)
[facility] Gastroenterology Patient Name: Brett Ray Procedure Date: 05/19/2022 10:22 AM MRN: [id number] Account #: [id number] Date of Birth: [birth year] Admit Type: Inpatient Age: 88 Room: [facility] ENDO ROOM 3 Gender: Male Note Status: Finalized Instrument Name: Jasper Riling [id number] Procedure:             Colonoscopy Indications:           Iron deficiency anemia Providers:             [name] MD, MD Referring MD:          Caprice Renshaw MD (Referring MD) Medicines:             Monitored Anesthesia Care Complications:         No immediate complications. Procedure:             Pre-Anesthesia Assessment:                        - Prior to the procedure, a History and Physical was                         performed, and patient medications, allergies and                         sensitivities were reviewed. The patient's tolerance                         of previous anesthesia was reviewed.                        - The risks and benefits of the procedure and the                         sedation options and risks were discussed with the                         patient. All questions were answered and informed                         consent was obtained.                        - ASA Grade Assessment: II - A patient with mild                         systemic disease.                        After obtaining informed consent, the colonoscope was                         passed under direct vision. Throughout the procedure,                         the patient's blood pressure, pulse, and oxygen                         saturations were monitored continuously. The                         Colonoscope was introduced through  the anus and                         advanced to the the terminal ileum. The colonoscopy                         was extremely difficult due to inadequate bowel prep,                         significant looping and a tortuous colon. Successful                          completion of the procedure was aided by increasing                         the dose of sedation medication, applying abdominal                         pressure and lavage. The patient tolerated the                         procedure well. The quality of the bowel preparation                         was fair. Findings:      The perianal and digital rectal examinations were normal.      liquid dark stool was found in the entire colon [Visualization].      Red blood was found in the cecum. seen coming out of terminal ileum with       clots      The distal ileum contained red blood. Impression:            - Preparation of the colon was fair.                        - Stool in the entire examined colon.                        - Blood in the cecum.                        - Blood in the distal ileum.                        - No specimens collected. Recommendation:        - Return patient to hospital ward for ongoing care.                        - 1. keep npo                        2. Ct angiogram to detect source of small bowel                         bleeding                        3. If negative will need capsule study of small bowel  4. Monitor cbc and transfuse as needed Procedure Code(s):     --- Professional ---                        [id number], Colonoscopy, flexible; diagnostic, including                         collection of specimen(s) by brushing or washing, when                         performed (separate procedure) Diagnosis Code(s):     --- Professional ---                        K92.2, Gastrointestinal hemorrhage, unspecified                        D50.9, Iron deficiency anemia, unspecified CPT copyright 2019 American Medical Association. All rights reserved. The codes documented in this report are preliminary and upon coder review may  be revised to meet current compliance requirements. [name], MD [name] MD, MD 05/19/2022 11:22:40 AM This  report has been signed electronically. Number of Addenda: 0 Note Initiated On: 05/19/2022 10:22 AM Scope Withdrawal Time: 0 hours 13 minutes 20 seconds  Total Procedure Duration: 0 hours 30 minutes 12 seconds  Estimated Blood Loss:  Estimated blood loss: none.      [facility]

## 2022-05-19 NOTE — H&P (View-Only) (Signed)
[facility]   MRN : [id number]  Brett Ray is a 88 y.o. (1931/07/31) male who presents with chief complaint of GI bleed.  History of Present Illness:   I am asked to evaluate the patient by Dr. Vicente Males.  Patient is an 88 year old gentleman who presented to [facility] 2 days ago after several bouts of nonbloody diarrhea.  The diarrhea was acute in onset and is atypical for the patient.  He denied nausea vomiting or fever or chills.  He denies abdominal pain.  There is no history of chronic GI issues or diverticulosis.  On evaluation in the ER he was noted to have heme positive stools.  Subsequently the patient was noted to have blood per rectum.  A CT angiogram of the abdomen and pelvis was performed which initially was thought to show bleeding from the hepatic flexure but on secondary evaluation is consistent with bleeding from the terminal ileum and the cecal area.  Dr. Vicente Males from GI was consulted and colonoscopy earlier today which showed bright red blood and clots at the ileocecal valve but the colon was otherwise normal.  The patient has been bleeding significantly and is required blood transfusions.  At this point I am asked to evaluate for possible embolization. Current Meds  Medication Sig   citalopram (CELEXA) 10 MG tablet Take 1 tablet by mouth daily.   finasteride (PROSCAR) 5 MG tablet Take 5 mg by mouth daily.   latanoprost (XALATAN) 0.005 % ophthalmic solution 1 drop at bedtime.   levothyroxine (SYNTHROID) 137 MCG tablet Take 137 mcg by mouth daily before breakfast.   meloxicam (MOBIC) 15 MG tablet Take 1 tablet by mouth daily.   methocarbamol (ROBAXIN) 500 MG tablet Take 500 mg by mouth 2 (two) times daily.   omeprazole (PRILOSEC) 20 MG capsule Take 20 mg by mouth daily.   Probiotic Product (PROBIOTIC DAILY PO) Take by mouth daily.   rosuvastatin (CRESTOR) 20 MG tablet TAKE 1 TABLET(20 MG) BY MOUTH AT  BEDTIME   vitamin C (ASCORBIC ACID) 500 MG tablet Take 500 mg by mouth daily.    Past Medical History:  Diagnosis Date   Asymptomatic Sinus bradycardia    Hyperlipidemia    Hypothyroidism    Moderate mitral regurgitation    a. 09/2018 Echo: Mod MR.   Orthostasis    Syncope    a. 09/2018 syncope in the setting of wine/empty stomach/sildenafil/orthostasis; b. 09/2018 Echo: EF 55-60%, no rwma, mild AI, mod MR, mildly dil RV/RA. PASP 42mHg; c. 09/2018 Carotid U/S: <39% bilat ICA dzs.    Past Surgical History:  Procedure Laterality Date   APPENDECTOMY     SHOULDER ARTHROSCOPY      Social History Social History   Tobacco Use   Smoking status: Never   Smokeless tobacco: Never  Vaping Use   Vaping Use: Never used  Substance Use Topics   Alcohol use: Yes    Alcohol/week: 2.0 standard drinks of alcohol    Types: 2 Glasses of wine per week    Comment: every evening   Drug use: Never    Family History History reviewed. No pertinent family history.  No Known Allergies   REVIEW OF SYSTEMS (Negative unless checked)  Constitutional: '[]'$ Weight loss  '[]'$ Fever  '[]'$ Chills Cardiac: '[]'$ Chest pain   '[]'$ Chest pressure   '[]'$ Palpitations   '[]'$ Shortness of breath when laying flat   '[]'$ Shortness of breath with  exertion. Vascular:  '[x]'$ Pain in legs with walking   '[]'$ Pain in legs at rest  '[]'$ History of DVT   '[]'$ Phlebitis   '[]'$ Swelling in legs   '[]'$ Varicose veins   '[]'$ Non-healing ulcers Pulmonary:   '[]'$ Uses home oxygen   '[]'$ Productive cough   '[]'$ Hemoptysis   '[]'$ Wheeze  '[]'$ COPD   '[]'$ Asthma Neurologic:  '[]'$ Dizziness   '[]'$ Seizures   '[]'$ History of stroke   '[]'$ History of TIA  '[]'$ Aphasia   '[]'$ Vissual changes   '[]'$ Weakness or numbness in arm   '[]'$ Weakness or numbness in leg Musculoskeletal:   '[]'$ Joint swelling   '[]'$ Joint pain   '[]'$ Low back pain Hematologic:  '[]'$ Easy bruising  '[]'$ Easy bleeding   '[]'$ Hypercoagulable state   '[]'$ Anemic Gastrointestinal:  '[]'$ Diarrhea   '[]'$ Vomiting  '[]'$ Gastroesophageal reflux/heartburn   '[]'$ Difficulty  swallowing. Genitourinary:  '[]'$ Chronic kidney disease   '[]'$ Difficult urination  '[]'$ Frequent urination   '[]'$ Blood in urine Skin:  '[]'$ Rashes   '[]'$ Ulcers  Psychological:  '[]'$ History of anxiety   '[]'$  History of major depression.  Physical Examination  Vitals:   05/19/22 1915 05/19/22 1930 05/19/22 1945 05/19/22 2000  BP: (!) 180/62     Pulse: 61 64 65 64  Resp: '17 13 14 15  '$ Temp:      TempSrc:      SpO2: 97% 96% 96% 97%  Weight:      Height:       Body mass index is 23.04 kg/m. Gen: WD/WN, NAD Head: [place]/AT, No temporalis wasting.  Ear/Nose/Throat: Hearing grossly intact, nares w/o erythema or drainage Eyes: PER, EOMI, sclera nonicteric.  Neck: Supple, no masses.  No bruit or JVD.  Pulmonary:  Good air movement, no audible wheezing, no use of accessory muscles.  Cardiac: RRR, normal S1, S2, no Murmurs. Vascular:   Vessel Right Left  Radial Palpable Palpable  Gastrointestinal: soft, non-distended. No guarding/no peritoneal signs.  Musculoskeletal: M/S 5/5 throughout.  No visible deformity.  Neurologic: CN 2-12 intact. Pain and light touch intact in extremities.  Symmetrical.  Speech is fluent. Motor exam as listed above. Psychiatric: Judgment intact, Mood & affect appropriate for pt's clinical situation. Dermatologic: No rashes or ulcers noted.  No changes consistent with cellulitis.   CBC Lab Results  Component Value Date   WBC 6.3 05/18/2022   HGB 6.8 (L) 05/19/2022   HCT 19.5 (L) 05/19/2022   MCV 88.5 05/18/2022   PLT 145 (L) 05/18/2022    BMET    Component Value Date/Time   NA 131 (L) 05/17/2022 0649   K 4.1 05/17/2022 0649   CL 101 05/17/2022 0649   CO2 22 05/17/2022 0649   GLUCOSE 154 (H) 05/17/2022 0649   BUN 25 (H) 05/17/2022 0649   CREATININE 0.83 05/17/2022 0649   CALCIUM 8.4 (L) 05/17/2022 0649   GFRNONAA >60 05/17/2022 0649   GFRAA >60 09/22/2018 2023   Estimated Creatinine Clearance: 60.3 mL/min (by C-G formula based on SCr of 0.83 mg/dL).  COAG Lab  Results  Component Value Date   INR 1.2 05/17/2022    Radiology DG C-Arm 1-60 Min-No Report  Result Date: 05/19/2022 Fluoroscopy was utilized by the requesting physician.  No radiographic interpretation.   PERIPHERAL VASCULAR CATHETERIZATION  Result Date: 05/19/2022 See surgical note for result.  CT Angio Abd/Pel w/ and/or w/o  Addendum Date: 05/19/2022   ADDENDUM REPORT: 05/19/2022 14:13 ADDENDUM: Case was discussed with GI team, with no colonic hemorrhage identified on colonoscopy, and positive findings of hematochezia of terminal ileum. FINDINGS: After review, the puddling/pooling of contrast is within loops of small  bowel at the distal ileum/terminal ileum, however the terminal ileum loops are located right upper quadrant between liver and the abdominal wall. IMPRESSION: After secondary review, CT angiogram is positive for evidence of acute GI hemorrhage, however not within hepatic flexure, but instead within distal ileal loops/terminal ileum. Distal small bowel loops are transposed into the right upper quadrant, superficial to the liver. Electronically Signed   By: Corrie Mckusick D.O.   On: 05/19/2022 14:13   Result Date: 05/19/2022 CLINICAL DATA:  88 year old male with GI bleeding EXAM: CTA ABDOMEN AND PELVIS WITHOUT AND WITH CONTRAST TECHNIQUE: Multidetector CT imaging of the abdomen and pelvis was performed using the standard protocol during bolus administration of intravenous contrast. Multiplanar reconstructed images and MIPs were obtained and reviewed to evaluate the vascular anatomy. RADIATION DOSE REDUCTION: This exam was performed according to the departmental dose-optimization program which includes automated exposure control, adjustment of the mA and/or kV according to patient size and/or use of iterative reconstruction technique. CONTRAST:  142m OMNIPAQUE IOHEXOL 350 MG/ML SOLN COMPARISON:  07/16/2021, 05/17/2022 FINDINGS: VASCULAR Aorta: Atherosclerotic changes of the abdominal aorta.  Tortuosity. No no wall thickening or inflammatory changes. Irregular plaque/chronic ulcerated plaque along the right aspect of the aorta, just inferior to the right renal artery takeoff. This results in maximum diameter of the aorta on the coronal images 25 mm. Celiac: Atherosclerotic changes without high-grade stenosis SMA: Atherosclerotic changes without high-grade stenosis Renals: - Right: Right renal artery patent. - Left: Atherosclerotic changes at the origin of the left renal artery, estimated 50% narrowing. IMA: Inferior mesenteric artery is patent. Right lower extremity: Tortuosity of the right iliac system. No aneurysm, dissection, or occlusion. Mild atherosclerosis. Hypogastric artery is patent. Common femoral artery patent. Proximal SFA and profunda femoris patent. Left lower extremity: Tortuosity of the left iliac system. No aneurysm, dissection, or occlusion. Hypogastric artery is patent. Mild atherosclerosis. Common femoral artery patent. Proximal SFA and profunda femoris patent. Veins: Unremarkable appearance of the venous system. Review of the MIP images confirms the above findings. NON-VASCULAR Lower chest: No acute. Hepatobiliary: Unremarkable appearance of the liver. High density material within the gallbladder, potentially sludge/microlithiasis. Pancreas: Rounded low-density lesion within the pancreatic head associated with the ducts near the ampulla. No inflammatory changes. Spleen: Punctate calcifications, suggesting prior granulomatous disease. Adrenals/Urinary Tract: - Right adrenal gland: Unremarkable - Left adrenal gland: Unremarkable. - Right kidney: No hydronephrosis, nephrolithiasis, inflammation, or ureteral dilation. No focal lesion. - Left Kidney: No hydronephrosis, nephrolithiasis, inflammation, or ureteral dilation. No focal lesion. - Urinary Bladder: Urinary bladder partially distended. Diverticulum at the bladder base. Stomach/Bowel: - Stomach: Small hiatal hernia.  Otherwise  unremarkable stomach. - Small bowel: Small bowel demonstrates no transition point, with mild gaseous distension compared to the prior. No extravasation of contrast or puddling of contrast. No wall thickening. Wall enhancement appears within normal limits. - Appendix: Appendix is not visualized, however, no inflammatory changes are present adjacent to the cecum to indicate an appendicitis. - Colon: Fluid-filled colon. Puddling of contrast present at the hepatic flexure. Questionable wall thickening of the ascending colon/hepatic flexure at this site persists. Left-sided diverticula again noted. Lymphatic: No adenopathy. Mesenteric: No free fluid or air. No mesenteric adenopathy. Reproductive: Transverse diameter of the prostate 34 mm. Right-sided hydrocele. Other: Bilateral fat containing inguinal hernia. Musculoskeletal: Degenerative changes of the spine. No displaced fracture. No significant bony canal narrowing. No aggressive lytic or sclerotic lesions. IMPRESSION: CT angiogram positive for evidence of acute GI hemorrhage, with accumulation of extravasated contrast at the hepatic  flexure. Associated wall thickening, suggesting acute inflammation/colitis. These above results will be called to the ordering clinician or representative by the Radiologist Assistant, and communication documented in the PACS or [employer]. 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 Aortic Atherosclerosis (ICD10-I70.0). Additional ancillary findings as above. Signed, Dulcy Fanny. Nadene Rubins, RPVI [facility] Radiology Electronically Signed: By: Corrie Mckusick D.O. On: 05/19/2022 12:49   CT Angio Abd/Pel W and/or Wo Contrast  Result Date: 05/17/2022 CLINICAL DATA:  Diarrhea, GI bleed. EXAM: CTA ABDOMEN AND PELVIS WITHOUT AND WITH CONTRAST TECHNIQUE: Multidetector CT imaging of the abdomen and pelvis was performed using the standard protocol during bolus  administration of intravenous contrast. Multiplanar reconstructed images and MIPs were obtained and reviewed to evaluate the vascular anatomy. RADIATION DOSE REDUCTION: This exam was performed according to the departmental dose-optimization program which includes automated exposure control, adjustment of the mA and/or kV according to patient size and/or use of iterative reconstruction technique. CONTRAST:  79m OMNIPAQUE IOHEXOL 350 MG/ML SOLN COMPARISON:  CT abdomen dated 07/16/2021 FINDINGS: VASCULAR Aorta: Extensive aortic atherosclerosis, including ulcerated plaque of the ectatic infrarenal abdominal aorta. No aortic aneurysm or evidence of acute dissection. No significant stenosis. Celiac: Patent without evidence of aneurysm, dissection, vasculitis or significant stenosis. SMA: Patent without evidence of aneurysm, dissection, vasculitis or significant stenosis. Atherosclerosis at the origin and at the proximal branching of the SMA, but normal contrast flow is seen into the more distal branches without evidence of a hemodynamically significant stenosis. Renals: Atherosclerosis at the bilateral ostia, but normal flow into the renal arteries and distal branches without evidence of a hemodynamically significant stenosis. IMA: Patent without evidence of aneurysm, dissection, vasculitis or significant stenosis. Inflow: Patent without evidence of aneurysm, dissection, vasculitis or significant stenosis. Proximal Outflow: Bilateral common femoral and visualized portions of the superficial and profunda femoral arteries are patent without evidence of aneurysm, dissection, vasculitis or significant stenosis. Veins: No obvious venous abnormality within the limitations of this arterial phase study. Review of the MIP images confirms the above findings. NON-VASCULAR Lower chest: No acute abnormality. Hepatobiliary: No focal liver abnormality is seen. No gallstones, gallbladder wall thickening, or biliary dilatation. Pancreas:  Unremarkable. No pancreatic ductal dilatation or surrounding inflammatory changes. Spleen: Normal in size without focal abnormality. Adrenals/Urinary Tract: Adrenal glands are unremarkable. Kidneys appear normal without mass, stone or hydronephrosis. No perinephric fluid. No ureteral or bladder calculi are identified. Bladder is unremarkable. Stomach/Bowel: No dilated large or small bowel loops. Mild thickening of the walls of the ascending colon, with subtle pericolonic inflammation/fluid stranding. Appendix is not seen but there are no focal inflammatory changes about the cecum to suggest acute appendicitis. No active hemorrhage is identified within the large or small bowel. Scattered diverticulosis within the sigmoid colon but no focal inflammatory change to suggest acute diverticulitis. Stomach is unremarkable, partially decompressed. Lymphatic: No enlarged lymph nodes are seen. Reproductive: Prostate is unremarkable. Other: Trace free fluid in the lower pelvis. No additional free fluid is seen. No abscess collection. No free intraperitoneal air. Musculoskeletal: No acute-appearing osseous abnormality. Degenerative spondylosis of the slightly scoliotic thoracolumbar spine, mild to moderate in degree. IMPRESSION: 1. No active hemorrhage is identified within the large or small bowel. 2. Mild thickening of the walls of the ascending colon, with subtle pericolonic inflammation/fluid stranding, suspicious for a mild colitis of infectious or inflammatory nature. 3. Colonic diverticulosis without evidence of acute diverticulitis. Aortic Atherosclerosis (ICD10-I70.0). Electronically Signed   By: SRoxy HorsemanD.  On: 05/17/2022 09:26     Assessment/Plan GI bleed: The patient has evidence for persistent GI bleed and review of both the CT scan as well as the colonoscopy and in discussion with Dr. Vicente Males it appears the most likely source is the terminal ileum.  Given this information I have discussed with the patient  embolization as a method of stopping the bleeding.  I have reviewed the risks and benefits with him as well as the procedure itself.  All of his questions were answered and he agreed to proceed with angiography and embolization which we will arrange for this evening given his persistent bleeding.  Post procedure we will monitor his white count as well as his hemoglobin.  2.  Anemia of blood loss secondary to his GI bleed: Patient has already received blood transfusion.  We will continue to monitor his H&H and further transfusions will be based on his hemodynamics as well as his hemoglobin/hematocrit.  3.  Hypothyroidism: Continue hormone replacement as ordered and reviewed, no changes at this time.  4.   Hyperlipidemia: Continue statin as ordered and reviewed, no changes at this time    Hortencia Pilar, MD  05/19/2022 9:51 PM

## 2022-05-19 NOTE — Progress Notes (Signed)
PROGRESS NOTE    Brett Ray  LEX:[id number] DOB: September 03, 1931 DOA: 05/17/2022 PCP: Derinda Late, MD    Brief Narrative:  88 year old gentleman with history of hypertension, hyperlipidemia, hypothyroidism, mitral valve regurgitation, BPH presented to the emergency room with diarrhea and dark stool.  In the emergency room hemodynamically stable.  Hemoglobin 7.6 from recent hemoglobin of 12.  FOBT positive.  Blood pressure stable.  CTA of the abdomen pelvis negative for acute bleeding.  Admitted with acute GI bleeding.   Assessment & Plan:   Acute blood loss anemia , symptomatic anemia, low ferritin: Baseline hemoglobin 12-presentation hemoglobin 7.6-6.5 after hydration-2 units of PRBC-7.8-6.9 -1 more unit of PRBC today because he is going for procedure.   Remains on IV Protonix. Also received 1 dose of Feraheme.  Holding aspirin and Mobic. Continue close monitoring of hemoglobin. For EGD and colonoscopy today.  Hopefully will find bleeding source as he had some trace blood on his stool.  Essential hypertension: Blood pressure stable.  Holding antihypertensives.  Hypothyroidism: On Synthroid.  Depression on Celexa.    DVT prophylaxis: SCDs Start: 05/17/22 0957   Code Status: Full code Family Communication: Wife at the bedside Disposition Plan: Status is: Inpatient Remains inpatient appropriate because: Active GI bleeding.  Inpatient procedures planned.     Consultants:  Gastroenterology  Procedures:  Planned  Antimicrobials:  None   Subjective:  Seen and examined.  No overnight events.  Stressful night with bowel prep, however denies any discomfort today. He had some trace blood with loose stool last night after bowel prep.  Denies any nausea vomiting or abdominal pain. Hemoglobin 6.9.  Agreed for another unit of transfusion.  Objective: Vitals:   05/19/22 0755 05/19/22 0900 05/19/22 0934 05/19/22 1007  BP: (!) 142/58 (!) 169/56 (!) 168/48 (!) 180/59  Pulse:  60 (!) 55 62 (!) 54  Resp: '16 17 16 18  '$ Temp: 97.6 F (36.4 C) 98 F (36.7 C) 98.5 F (36.9 C) (!) 96.9 F (36.1 C)  TempSrc:  Oral Oral Temporal  SpO2: 100% 100% 99% 95%  Weight:      Height:        Intake/Output Summary (Last 24 hours) at 05/19/2022 1036 Last data filed at 05/19/2022 0934 Gross per 24 hour  Intake 1802.73 ml  Output --  Net 1802.73 ml   Filed Weights   05/17/22 0644  Weight: 70.8 kg    Examination:  General exam: Appears calm and comfortable  Respiratory system: No added sounds. Cardiovascular system: S1 & S2 heard, RRR. No pedal edema. Gastrointestinal system: Soft.  Nontender.   Central nervous system: Alert and oriented. No focal neurological deficits. Extremities: Symmetric 5 x 5 power. Skin: No rashes, lesions or ulcers Psychiatry: Judgement and insight appear normal. Mood & affect appropriate.     Data Reviewed: I have personally reviewed following labs and imaging studies  CBC: Recent Labs  Lab 05/17/22 0649 05/17/22 1639 05/17/22 2134 05/18/22 0413 05/18/22 1035 05/18/22 1701 05/19/22 0451  WBC 8.9 8.2 6.9 7.7 6.3  --   --   NEUTROABS 6.9  --   --   --   --   --   --   HGB 7.6* 6.9* 6.0* 6.5* 7.8* 7.7* 6.8*  HCT 22.7* 20.2* 17.6* 19.3* 22.4* 22.5* 19.5*  MCV 93.0 94.0 93.6 93.2 88.5  --   --   PLT 199 191 164 154 145*  --   --    Basic Metabolic Panel: Recent Labs  Lab 05/17/22 [phone]  NA 131*  K 4.1  CL 101  CO2 22  GLUCOSE 154*  BUN 25*  CREATININE 0.83  CALCIUM 8.4*   GFR: Estimated Creatinine Clearance: 60.3 mL/min (by C-G formula based on SCr of 0.83 mg/dL). Liver Function Tests: Recent Labs  Lab 05/17/22 0649  AST 21  ALT 16  ALKPHOS 29*  BILITOT 0.7  PROT 5.4*  ALBUMIN 3.6   Recent Labs  Lab 05/17/22 0649  LIPASE 45   No results for input(s): "AMMONIA" in the last 168 hours. Coagulation Profile: Recent Labs  Lab 05/17/22 1639  INR 1.2   Cardiac Enzymes: No results for input(s): "CKTOTAL",  "CKMB", "CKMBINDEX", "TROPONINI" in the last 168 hours. BNP (last 3 results) No results for input(s): "PROBNP" in the last 8760 hours. HbA1C: No results for input(s): "HGBA1C" in the last 72 hours. CBG: Recent Labs  Lab 05/18/22 0807 05/19/22 0800  GLUCAP 106* 103*   Lipid Profile: No results for input(s): "CHOL", "HDL", "LDLCALC", "TRIG", "CHOLHDL", "LDLDIRECT" in the last 72 hours. Thyroid Function Tests: No results for input(s): "TSH", "T4TOTAL", "FREET4", "T3FREE", "THYROIDAB" in the last 72 hours. Anemia Panel: Recent Labs    05/17/22 0649 05/17/22 1639  VITAMINB12  --  202  FOLATE 8.6  --   FERRITIN 21*  --   TIBC 349  --   IRON 56  --   RETICCTPCT 2.0  --    Sepsis Labs: No results for input(s): "PROCALCITON", "LATICACIDVEN" in the last 168 hours.  No results found for this or any previous visit (from the past 240 hour(s)).       Radiology Studies: No results found.      Scheduled Meds:  [MAR Hold] acidophilus  1 capsule Oral Daily   [MAR Hold] vitamin C  500 mg Oral Daily   [MAR Hold] citalopram  10 mg Oral Daily   [MAR Hold] ferrous sulfate  325 mg Oral Q breakfast   [MAR Hold] finasteride  5 mg Oral Daily   [MAR Hold] latanoprost  1 drop Both Eyes QHS   [MAR Hold] levothyroxine  137 mcg Oral QAC breakfast   [MAR Hold] methocarbamol  500 mg Oral BID   [MAR Hold] pantoprazole (PROTONIX) IV  40 mg Intravenous Q12H   [MAR Hold] rosuvastatin  20 mg Oral Daily   Continuous Infusions:  sodium chloride 75 mL/hr at 05/19/22 0330   sodium chloride 20 mL/hr at 05/19/22 1025     LOS: 2 days    Time spent: 35 minutes    Barb Merino, MD [facility] Pager [phone]

## 2022-05-19 NOTE — Interval H&P Note (Signed)
History and Physical Interval Note:  05/19/2022 10:07 PM  Brett Ray  has presented today for surgery, with the diagnosis of GI bleed.  The various methods of treatment have been discussed with the patient and family. After consideration of risks, benefits and other options for treatment, the patient has consented to  Procedure(s): VISCERAL ARTERY INTERVENTION (N/A) as a surgical intervention.  The patient's history has been reviewed, patient examined, no change in status, stable for surgery.  I have reviewed the patient's chart and labs.  Questions were answered to the patient's satisfaction.     Hortencia Pilar

## 2022-05-19 NOTE — Anesthesia Preprocedure Evaluation (Signed)
Anesthesia Evaluation  Patient identified by MRN, date of birth, ID band Patient awake    Reviewed: Allergy & Precautions, H&P , NPO status , Patient's Chart, lab work & pertinent test results, reviewed documented beta blocker date and time   History of Anesthesia Complications Negative for: history of anesthetic complications  Airway Mallampati: II  TM Distance: >3 FB Neck ROM: full    Dental  (+) Dental Advidsory Given, Caps, Teeth Intact   Pulmonary neg pulmonary ROS,    Pulmonary exam normal breath sounds clear to auscultation       Cardiovascular Exercise Tolerance: Good negative cardio ROS Normal cardiovascular exam Rhythm:regular Rate:Normal     Neuro/Psych PSYCHIATRIC DISORDERS Depression negative neurological ROS     GI/Hepatic Neg liver ROS, GERD  ,  Endo/Other  neg diabetesHypothyroidism   Renal/GU negative Renal ROS  negative genitourinary   Musculoskeletal   Abdominal   Peds  Hematology negative hematology ROS (+)   Anesthesia Other Findings Past Medical History: No date: Asymptomatic Sinus bradycardia No date: Hyperlipidemia No date: Hypothyroidism No date: Moderate mitral regurgitation     Comment:  a. 09/2018 Echo: Mod MR. No date: Orthostasis No date: Syncope     Comment:  a. 09/2018 syncope in the setting of wine/empty               stomach/sildenafil/orthostasis; b. 09/2018 Echo: EF               55-60%, no rwma, mild AI, mod MR, mildly dil RV/RA. PASP               38mHg; c. 09/2018 Carotid U/S: <39% bilat ICA dzs.   Reproductive/Obstetrics negative OB ROS                             Anesthesia Physical Anesthesia Plan  ASA: 2  Anesthesia Plan: General   Post-op Pain Management:    Induction: Intravenous  PONV Risk Score and Plan: 2 and Propofol infusion and TIVA  Airway Management Planned: Natural Airway and Nasal Cannula  Additional Equipment:    Intra-op Plan:   Post-operative Plan:   Informed Consent: I have reviewed the patients History and Physical, chart, labs and discussed the procedure including the risks, benefits and alternatives for the proposed anesthesia with the patient or authorized representative who has indicated his/her understanding and acceptance.     Dental Advisory Given  Plan Discussed with: Anesthesiologist, CRNA and Surgeon  Anesthesia Plan Comments:         Anesthesia Quick Evaluation

## 2022-05-19 NOTE — Transfer of Care (Signed)
Immediate Anesthesia Transfer of Care Note  Patient: Brett Ray  Procedure(s) Performed: VISCERAL ARTERY INTERVENTION  Patient Location: Short Stay  Anesthesia Type:General  Level of Consciousness: awake and alert   Airway & Oxygen Therapy: Patient Spontanous Breathing  Post-op Assessment: Report given to RN and Post -op Vital signs reviewed and stable  Post vital signs: Reviewed and stable  Last Vitals:  Vitals Value Taken Time  BP 161/68 05/19/22 2200  Temp    Pulse 69 05/19/22 2203  Resp 20 05/19/22 2203  SpO2 98 % 05/19/22 2203  Vitals shown include unvalidated device data.  Last Pain:  Vitals:   05/19/22 2155  TempSrc:   PainSc: 0-No pain         Complications: No notable events documented.

## 2022-05-20 ENCOUNTER — Encounter: Payer: Self-pay | Admitting: Vascular Surgery

## 2022-05-20 DIAGNOSIS — D649 Anemia, unspecified: Secondary | ICD-10-CM | POA: Diagnosis not present

## 2022-05-20 DIAGNOSIS — E538 Deficiency of other specified B group vitamins: Secondary | ICD-10-CM

## 2022-05-20 DIAGNOSIS — K922 Gastrointestinal hemorrhage, unspecified: Secondary | ICD-10-CM | POA: Diagnosis not present

## 2022-05-20 LAB — HEMOGLOBIN AND HEMATOCRIT, BLOOD
HCT: 22.2 % — ABNORMAL LOW (ref 39.0–52.0)
HCT: 23 % — ABNORMAL LOW (ref 39.0–52.0)
Hemoglobin: 7.7 g/dL — ABNORMAL LOW (ref 13.0–17.0)
Hemoglobin: 8.1 g/dL — ABNORMAL LOW (ref 13.0–17.0)

## 2022-05-20 LAB — BPAM RBC
Blood Product Expiration Date: 202307082359
Blood Product Expiration Date: 202307162359
Blood Product Expiration Date: 202307182359
Blood Product Expiration Date: 202308052359
ISSUE DATE / TIME: 202307042206
ISSUE DATE / TIME: 202307050514
ISSUE DATE / TIME: 202307060839
ISSUE DATE / TIME: 202307061546
Unit Type and Rh: 1700
Unit Type and Rh: 5100
Unit Type and Rh: 600
Unit Type and Rh: 6200

## 2022-05-20 LAB — TYPE AND SCREEN
ABO/RH(D): AB POS
Antibody Screen: NEGATIVE
Unit division: 0
Unit division: 0
Unit division: 0
Unit division: 0

## 2022-05-20 LAB — CBC
HCT: 22.4 % — ABNORMAL LOW (ref 39.0–52.0)
Hemoglobin: 7.9 g/dL — ABNORMAL LOW (ref 13.0–17.0)
MCH: 30.6 pg (ref 26.0–34.0)
MCHC: 35.3 g/dL (ref 30.0–36.0)
MCV: 86.8 fL (ref 80.0–100.0)
Platelets: 176 10*3/uL (ref 150–400)
RBC: 2.58 MIL/uL — ABNORMAL LOW (ref 4.22–5.81)
RDW: 16.8 % — ABNORMAL HIGH (ref 11.5–15.5)
WBC: 18.7 10*3/uL — ABNORMAL HIGH (ref 4.0–10.5)
nRBC: 0.1 % (ref 0.0–0.2)

## 2022-05-20 LAB — GLUCOSE, CAPILLARY
Glucose-Capillary: 142 mg/dL — ABNORMAL HIGH (ref 70–99)
Glucose-Capillary: 179 mg/dL — ABNORMAL HIGH (ref 70–99)

## 2022-05-20 MED ORDER — KCL IN DEXTROSE-NACL 20-5-0.45 MEQ/L-%-% IV SOLN
INTRAVENOUS | Status: DC
  Filled 2022-05-20 (×6): qty 1000

## 2022-05-20 MED ORDER — CYANOCOBALAMIN 1000 MCG/ML IJ SOLN
1000.0000 ug | Freq: Once | INTRAMUSCULAR | Status: AC
  Administered 2022-05-20: 1000 ug via INTRAMUSCULAR
  Filled 2022-05-20: qty 1

## 2022-05-20 MED ORDER — SODIUM CHLORIDE 0.9 % IV SOLN
3.0000 g | Freq: Three times a day (TID) | INTRAVENOUS | Status: DC
  Administered 2022-05-20 – 2022-05-22 (×6): 3 g via INTRAVENOUS
  Filled 2022-05-20 (×2): qty 3
  Filled 2022-05-20 (×4): qty 8
  Filled 2022-05-20: qty 3
  Filled 2022-05-20: qty 8

## 2022-05-20 NOTE — Progress Notes (Signed)
[facility]  Daily Progress Note   Subjective  - 1 Day Post-Op  Patient in bed noting that he does not have much of an appetite and he feels very weak  Hemoglobins have been stable since embolization.  Objective Vitals:   05/19/22 2230 05/19/22 2318 05/20/22 0506 05/20/22 0833  BP: (!) 163/80 (!) 152/73 (!) 150/69 (!) 157/66  Pulse: 68 64 76 66  Resp: '15 18 18 18  '$ Temp:  98.1 F (36.7 C) 98.5 F (36.9 C) 98.1 F (36.7 C)  TempSrc:  Oral Oral Oral  SpO2: 96% 100% 97% 97%  Weight:      Height:        Intake/Output Summary (Last 24 hours) at 05/20/2022 1935 Last data filed at 05/20/2022 1851 Gross per 24 hour  Intake 1155.45 ml  Output 1400 ml  Net -244.55 ml    PULM  Normal effort , no use of accessory muscles CV  No JVD, RRR Abd      No distended, nontender VASC  groin site clean dry and intact  Laboratory CBC    Component Value Date/Time   WBC 18.7 (H) 05/20/2022 1049   HGB 7.9 (L) 05/20/2022 1049   HCT 22.4 (L) 05/20/2022 1049   PLT 176 05/20/2022 1049    BMET    Component Value Date/Time   NA 131 (L) 05/17/2022 0649   K 4.1 05/17/2022 0649   CL 101 05/17/2022 0649   CO2 22 05/17/2022 0649   GLUCOSE 154 (H) 05/17/2022 0649   BUN 25 (H) 05/17/2022 0649   CREATININE 0.83 05/17/2022 0649   CALCIUM 8.4 (L) 05/17/2022 0649   GFRNONAA >60 05/17/2022 0649   GFRAA >60 09/22/2018 2023    Assessment/Planning: POD #1 s/p mesenteric embolization for bleeding  The patient's hemoglobins have been stable since the procedure indicating success of embolization However his white count has increased and most recently was 18.7.  As is expected he appears to have some mild ischemic changes.  Therefore I have ordered antibiotics-Unasyn I have restarted his IV fluids to make sure he is well-hydrated especially since his p.o. intake is very poor.  This should help alleviate the ischemic changes related to his embolization.  I have also asked physical  therapy to evaluate and treat given his significant weakness with all that he has been through.  Also I have discontinued his bedrest since the order was persistent in the chart but was clearly stating that it was only for the 4 hours post angio  Hortencia Pilar  05/20/2022, 7:35 PM

## 2022-05-20 NOTE — Progress Notes (Signed)
Pt refusing bed alarm. Pt states "it goes off when I'm just turning over." Confirmed on lowest setting. Educated patient on importance of alarm and someone being in room when he needs to get OOB. Pt confirmed refusal of bed alarm, voiced understanding of using call light when in need of assistance.

## 2022-05-20 NOTE — Progress Notes (Signed)
PROGRESS NOTE    Gaje Tennyson  IRS:[id number] DOB: [birth year] DOA: 05/17/2022 PCP: Derinda Late, MD    Brief Narrative:  88 year old gentleman with history of hypertension, hyperlipidemia, hypothyroidism, mitral valve regurgitation, BPH presented to the emergency room with diarrhea and dark stool.  In the emergency room hemodynamically stable.  Hemoglobin 7.6 from recent hemoglobin of 12.  FOBT positive.  Blood pressure stable.  CTA of the abdomen pelvis negative for acute bleeding.  Admitted with acute GI bleeding. EGD and colonoscopy with no source of bleeding, bleeding from the small intestine. CT angiogram with bleeding from hepatic flexure area Selective angiogram and coil embolization small bowel by vascular surgery.   Assessment & Plan:   Acute blood loss anemia , symptomatic anemia, low ferritin: Received total 4 units of PRBC and 1 unit of iron.  Hemoglobin 7.7.  Continue monitoring every 12 hours.  Remains on IV Protonix. EGD nonrevealing. Colonoscopy with bleeding from terminal ileum. With ongoing bleeding, underwent CT angiogram and found to have source of bleeding through small bowel. Vascular surgery, interventional with coil embolization SMA. Still having some rectal bleeding but is stabilizing. We will allow full liquid diet. GI and surgery following.  Transfuse with aspirin 7.  Essential hypertension: Blood pressure stable.  Holding antihypertensives.  Hypothyroidism: On Synthroid.  Depression on Celexa.    DVT prophylaxis: SCDs Start: 05/17/22 0957   Code Status: Full code Family Communication: Wife at the bedside Disposition Plan: Status is: Inpatient Remains inpatient appropriate because: Active GI bleeding.       Consultants:  Gastroenterology  Procedures:  EGD colonoscopy Angiogram and coil embolization  Antimicrobials:  None   Subjective:  Seen and examined.  No overnight events.  He just had a rough night and did not feel well.   Denies any nausea vomiting.  Mild right sided abdominal pain present. Had 1 episode of small amount of old looking blood today.  Objective: Vitals:   05/19/22 2230 05/19/22 2318 05/20/22 0506 05/20/22 0833  BP: (!) 163/80 (!) 152/73 (!) 150/69 (!) 157/66  Pulse: 68 64 76 66  Resp: '15 18 18 18  '$ Temp:  98.1 F (36.7 C) 98.5 F (36.9 C)   TempSrc:  Oral Oral   SpO2: 96% 100% 97% 97%  Weight:      Height:        Intake/Output Summary (Last 24 hours) at 05/20/2022 1200 Last data filed at 05/20/2022 1003 Gross per 24 hour  Intake 1338.48 ml  Output 701 ml  Net 637.48 ml    Filed Weights   05/17/22 0644  Weight: 70.8 kg    Examination:  General exam: Appears calm and comfortable  Respiratory system: No added sounds. Cardiovascular system: S1 & S2 heard, RRR. No pedal edema. Gastrointestinal system: Soft.  Mild tenderness along the right upper quadrant.  No rigidity or guarding. Central nervous system: Alert and oriented. No focal neurological deficits. Extremities: Symmetric 5 x 5 power. Skin: No rashes, lesions or ulcers Psychiatry: Judgement and insight appear normal. Mood & affect appropriate.     Data Reviewed: I have personally reviewed following labs and imaging studies  CBC: Recent Labs  Lab 05/17/22 0649 05/17/22 1639 05/17/22 2134 05/18/22 0413 05/18/22 1035 05/18/22 1701 05/19/22 0451 05/19/22 2213 05/20/22 0513 05/20/22 1049  WBC 8.9 8.2 6.9 7.7 6.3  --   --   --   --  18.7*  NEUTROABS 6.9  --   --   --   --   --   --   --   --   --  HGB 7.6* 6.9* 6.0* 6.5* 7.8* 7.7* 6.8* 7.7* 7.7* 7.9*  HCT 22.7* 20.2* 17.6* 19.3* 22.4* 22.5* 19.5* 22.9* 22.2* 22.4*  MCV 93.0 94.0 93.6 93.2 88.5  --   --   --   --  86.8  PLT 199 191 164 154 145*  --   --   --   --  465    Basic Metabolic Panel: Recent Labs  Lab 05/17/22 0649  NA 131*  K 4.1  CL 101  CO2 22  GLUCOSE 154*  BUN 25*  CREATININE 0.83  CALCIUM 8.4*    GFR: Estimated Creatinine Clearance:  60.3 mL/min (by C-G formula based on SCr of 0.83 mg/dL). Liver Function Tests: Recent Labs  Lab 05/17/22 0649  AST 21  ALT 16  ALKPHOS 29*  BILITOT 0.7  PROT 5.4*  ALBUMIN 3.6    Recent Labs  Lab 05/17/22 0649  LIPASE 45    No results for input(s): "AMMONIA" in the last 168 hours. Coagulation Profile: Recent Labs  Lab 05/17/22 1639  INR 1.2    Cardiac Enzymes: No results for input(s): "CKTOTAL", "CKMB", "CKMBINDEX", "TROPONINI" in the last 168 hours. BNP (last 3 results) No results for input(s): "PROBNP" in the last 8760 hours. HbA1C: No results for input(s): "HGBA1C" in the last 72 hours. CBG: Recent Labs  Lab 05/18/22 0807 05/19/22 0800 05/19/22 2316 05/20/22 0834  GLUCAP 106* 103* 100* 142*    Lipid Profile: No results for input(s): "CHOL", "HDL", "LDLCALC", "TRIG", "CHOLHDL", "LDLDIRECT" in the last 72 hours. Thyroid Function Tests: No results for input(s): "TSH", "T4TOTAL", "FREET4", "T3FREE", "THYROIDAB" in the last 72 hours. Anemia Panel: Recent Labs    05/17/22 1639  VITAMINB12 202    Sepsis Labs: No results for input(s): "PROCALCITON", "LATICACIDVEN" in the last 168 hours.  No results found for this or any previous visit (from the past 240 hour(s)).       Radiology Studies: DG C-Arm 1-60 Min-No Report  Result Date: 05/19/2022 Fluoroscopy was utilized by the requesting physician.  No radiographic interpretation.   PERIPHERAL VASCULAR CATHETERIZATION  Result Date: 05/19/2022 See surgical note for result.  CT Angio Abd/Pel w/ and/or w/o  Addendum Date: 05/19/2022   ADDENDUM REPORT: 05/19/2022 14:13 ADDENDUM: Case was discussed with GI team, with no colonic hemorrhage identified on colonoscopy, and positive findings of hematochezia of terminal ileum. FINDINGS: After review, the puddling/pooling of contrast is within loops of small bowel at the distal ileum/terminal ileum, however the terminal ileum loops are located right upper quadrant  between liver and the abdominal wall. IMPRESSION: After secondary review, CT angiogram is positive for evidence of acute GI hemorrhage, however not within hepatic flexure, but instead within distal ileal loops/terminal ileum. Distal small bowel loops are transposed into the right upper quadrant, superficial to the liver. Electronically Signed   By: Corrie Mckusick D.O.   On: 05/19/2022 14:13   Result Date: 05/19/2022 CLINICAL DATA:  88 year old male with GI bleeding EXAM: CTA ABDOMEN AND PELVIS WITHOUT AND WITH CONTRAST TECHNIQUE: Multidetector CT imaging of the abdomen and pelvis was performed using the standard protocol during bolus administration of intravenous contrast. Multiplanar reconstructed images and MIPs were obtained and reviewed to evaluate the vascular anatomy. RADIATION DOSE REDUCTION: This exam was performed according to the departmental dose-optimization program which includes automated exposure control, adjustment of the mA and/or kV according to patient size and/or use of iterative reconstruction technique. CONTRAST:  113m OMNIPAQUE IOHEXOL 350 MG/ML SOLN COMPARISON:  07/16/2021, 05/17/2022 FINDINGS: VASCULAR Aorta:  Atherosclerotic changes of the abdominal aorta. Tortuosity. No no wall thickening or inflammatory changes. Irregular plaque/chronic ulcerated plaque along the right aspect of the aorta, just inferior to the right renal artery takeoff. This results in maximum diameter of the aorta on the coronal images 25 mm. Celiac: Atherosclerotic changes without high-grade stenosis SMA: Atherosclerotic changes without high-grade stenosis Renals: - Right: Right renal artery patent. - Left: Atherosclerotic changes at the origin of the left renal artery, estimated 50% narrowing. IMA: Inferior mesenteric artery is patent. Right lower extremity: Tortuosity of the right iliac system. No aneurysm, dissection, or occlusion. Mild atherosclerosis. Hypogastric artery is patent. Common femoral artery patent.  Proximal SFA and profunda femoris patent. Left lower extremity: Tortuosity of the left iliac system. No aneurysm, dissection, or occlusion. Hypogastric artery is patent. Mild atherosclerosis. Common femoral artery patent. Proximal SFA and profunda femoris patent. Veins: Unremarkable appearance of the venous system. Review of the MIP images confirms the above findings. NON-VASCULAR Lower chest: No acute. Hepatobiliary: Unremarkable appearance of the liver. High density material within the gallbladder, potentially sludge/microlithiasis. Pancreas: Rounded low-density lesion within the pancreatic head associated with the ducts near the ampulla. No inflammatory changes. Spleen: Punctate calcifications, suggesting prior granulomatous disease. Adrenals/Urinary Tract: - Right adrenal gland: Unremarkable - Left adrenal gland: Unremarkable. - Right kidney: No hydronephrosis, nephrolithiasis, inflammation, or ureteral dilation. No focal lesion. - Left Kidney: No hydronephrosis, nephrolithiasis, inflammation, or ureteral dilation. No focal lesion. - Urinary Bladder: Urinary bladder partially distended. Diverticulum at the bladder base. Stomach/Bowel: - Stomach: Small hiatal hernia.  Otherwise unremarkable stomach. - Small bowel: Small bowel demonstrates no transition point, with mild gaseous distension compared to the prior. No extravasation of contrast or puddling of contrast. No wall thickening. Wall enhancement appears within normal limits. - Appendix: Appendix is not visualized, however, no inflammatory changes are present adjacent to the cecum to indicate an appendicitis. - Colon: Fluid-filled colon. Puddling of contrast present at the hepatic flexure. Questionable wall thickening of the ascending colon/hepatic flexure at this site persists. Left-sided diverticula again noted. Lymphatic: No adenopathy. Mesenteric: No free fluid or air. No mesenteric adenopathy. Reproductive: Transverse diameter of the prostate 34 mm.  Right-sided hydrocele. Other: Bilateral fat containing inguinal hernia. Musculoskeletal: Degenerative changes of the spine. No displaced fracture. No significant bony canal narrowing. No aggressive lytic or sclerotic lesions. IMPRESSION: CT angiogram positive for evidence of acute GI hemorrhage, with accumulation of extravasated contrast at the hepatic flexure. Associated wall thickening, suggesting acute inflammation/colitis. These above results will be called to the ordering clinician or representative by the Radiologist Assistant, and communication documented in the PACS or [employer]. 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 Aortic Atherosclerosis (ICD10-I70.0). Additional ancillary findings as above. Signed, Dulcy Fanny. Nadene Rubins, RPVI [facility] Radiology Electronically Signed: By: Corrie Mckusick D.O. On: 05/19/2022 12:49        Scheduled Meds:  acidophilus  1 capsule Oral Daily   vitamin C  500 mg Oral Daily   citalopram  10 mg Oral Daily   ferrous sulfate  325 mg Oral Q breakfast   finasteride  5 mg Oral Daily   latanoprost  1 drop Both Eyes QHS   levothyroxine  137 mcg Oral QAC breakfast   methocarbamol  500 mg Oral BID   pantoprazole (PROTONIX) IV  40 mg Intravenous Q12H   rosuvastatin  20 mg Oral Daily   sodium chloride flush  3 mL Intravenous Q12H   Continuous Infusions:  sodium  chloride 20 mL/hr at 05/19/22 1025   sodium chloride       LOS: 3 days    Time spent: 35 minutes    Barb Merino, MD [facility] Pager [phone]

## 2022-05-20 NOTE — Progress Notes (Signed)
Brett Ray , MD [address] [place], [place], [place], [address] [address], [place], [place], [place], [address] Phone: [phone]  Fax: [phone]   Brett Ray is being followed for gi bleed  Day 3 of follow up   Subjective: Underwent embolization with vascular surgery of terminal ileum bleed, feels tired , no abdominal pain , did have blood in her stools earlier today.  Objective: Vital signs in last 24 hours: Vitals:   05/19/22 2215 05/19/22 2230 05/19/22 2318 05/20/22 0506  BP: (!) 150/66 (!) 163/80 (!) 152/73 (!) 150/69  Pulse: 68 68 64 76  Resp: '20 15 18 18  '$ Temp:   98.1 F (36.7 C) 98.5 F (36.9 C)  TempSrc:   Oral Oral  SpO2: 97% 96% 100% 97%  Weight:      Height:       Weight change:   Intake/Output Summary (Last 24 hours) at 05/20/2022 9417 Last data filed at 05/20/2022 4081 Gross per 24 hour  Intake 1688.48 ml  Output 700 ml  Net 988.48 ml     Exam: Heart:: Regular rate and rhythm or S1S2 present Lungs: normal Abdomen: soft, nontender, normal bowel sounds   Lab Results: '@LABTEST2'$ @ Micro Results: No results found for this or any previous visit (from the past 240 hour(s)). Studies/Results: DG C-Arm 1-60 Min-No Report  Result Date: 05/19/2022 Fluoroscopy was utilized by the requesting physician.  No radiographic interpretation.   PERIPHERAL VASCULAR CATHETERIZATION  Result Date: 05/19/2022 See surgical note for result.  CT Angio Abd/Pel w/ and/or w/o  Addendum Date: 05/19/2022   ADDENDUM REPORT: 05/19/2022 14:13 ADDENDUM: Case was discussed with GI team, with no colonic hemorrhage identified on colonoscopy, and positive findings of hematochezia of terminal ileum. FINDINGS: After review, the puddling/pooling of contrast is within loops of small bowel at the distal ileum/terminal ileum, however the terminal ileum loops are located right upper quadrant between liver and the abdominal wall. IMPRESSION: After secondary review, CT angiogram is  positive for evidence of acute GI hemorrhage, however not within hepatic flexure, but instead within distal ileal loops/terminal ileum. Distal small bowel loops are transposed into the right upper quadrant, superficial to the liver. Electronically Signed   By: Corrie Mckusick D.O.   On: 05/19/2022 14:13   Result Date: 05/19/2022 CLINICAL DATA:  88 year old male with GI bleeding EXAM: CTA ABDOMEN AND PELVIS WITHOUT AND WITH CONTRAST TECHNIQUE: Multidetector CT imaging of the abdomen and pelvis was performed using the standard protocol during bolus administration of intravenous contrast. Multiplanar reconstructed images and MIPs were obtained and reviewed to evaluate the vascular anatomy. RADIATION DOSE REDUCTION: This exam was performed according to the departmental dose-optimization program which includes automated exposure control, adjustment of the mA and/or kV according to patient size and/or use of iterative reconstruction technique. CONTRAST:  148m OMNIPAQUE IOHEXOL 350 MG/ML SOLN COMPARISON:  07/16/2021, 05/17/2022 FINDINGS: VASCULAR Aorta: Atherosclerotic changes of the abdominal aorta. Tortuosity. No no wall thickening or inflammatory changes. Irregular plaque/chronic ulcerated plaque along the right aspect of the aorta, just inferior to the right renal artery takeoff. This results in maximum diameter of the aorta on the coronal images 25 mm. Celiac: Atherosclerotic changes without high-grade stenosis SMA: Atherosclerotic changes without high-grade stenosis Renals: - Right: Right renal artery patent. - Left: Atherosclerotic changes at the origin of the left renal artery, estimated 50% narrowing. IMA: Inferior mesenteric artery is patent. Right lower extremity: Tortuosity of the right iliac system. No aneurysm, dissection, or occlusion. Mild atherosclerosis. Hypogastric artery  is patent. Common femoral artery patent. Proximal SFA and profunda femoris patent. Left lower extremity: Tortuosity of the left iliac  system. No aneurysm, dissection, or occlusion. Hypogastric artery is patent. Mild atherosclerosis. Common femoral artery patent. Proximal SFA and profunda femoris patent. Veins: Unremarkable appearance of the venous system. Review of the MIP images confirms the above findings. NON-VASCULAR Lower chest: No acute. Hepatobiliary: Unremarkable appearance of the liver. High density material within the gallbladder, potentially sludge/microlithiasis. Pancreas: Rounded low-density lesion within the pancreatic head associated with the ducts near the ampulla. No inflammatory changes. Spleen: Punctate calcifications, suggesting prior granulomatous disease. Adrenals/Urinary Tract: - Right adrenal gland: Unremarkable - Left adrenal gland: Unremarkable. - Right kidney: No hydronephrosis, nephrolithiasis, inflammation, or ureteral dilation. No focal lesion. - Left Kidney: No hydronephrosis, nephrolithiasis, inflammation, or ureteral dilation. No focal lesion. - Urinary Bladder: Urinary bladder partially distended. Diverticulum at the bladder base. Stomach/Bowel: - Stomach: Small hiatal hernia.  Otherwise unremarkable stomach. - Small bowel: Small bowel demonstrates no transition point, with mild gaseous distension compared to the prior. No extravasation of contrast or puddling of contrast. No wall thickening. Wall enhancement appears within normal limits. - Appendix: Appendix is not visualized, however, no inflammatory changes are present adjacent to the cecum to indicate an appendicitis. - Colon: Fluid-filled colon. Puddling of contrast present at the hepatic flexure. Questionable wall thickening of the ascending colon/hepatic flexure at this site persists. Left-sided diverticula again noted. Lymphatic: No adenopathy. Mesenteric: No free fluid or air. No mesenteric adenopathy. Reproductive: Transverse diameter of the prostate 34 mm. Right-sided hydrocele. Other: Bilateral fat containing inguinal hernia. Musculoskeletal:  Degenerative changes of the spine. No displaced fracture. No significant bony canal narrowing. No aggressive lytic or sclerotic lesions. IMPRESSION: CT angiogram positive for evidence of acute GI hemorrhage, with accumulation of extravasated contrast at the hepatic flexure. Associated wall thickening, suggesting acute inflammation/colitis. These above results will be called to the ordering clinician or representative by the Radiologist Assistant, and communication documented in the PACS or [employer]. 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 Aortic Atherosclerosis (ICD10-I70.0). Additional ancillary findings as above. Signed, Dulcy Fanny. Nadene Rubins, RPVI [facility] Electronically Signed: By: Corrie Mckusick D.O. On: 05/19/2022 12:49   Medications: I have reviewed the patient's current medications. Scheduled Meds:  acidophilus  1 capsule Oral Daily   vitamin C  500 mg Oral Daily   citalopram  10 mg Oral Daily   ferrous sulfate  325 mg Oral Q breakfast   finasteride  5 mg Oral Daily   latanoprost  1 drop Both Eyes QHS   levothyroxine  137 mcg Oral QAC breakfast   methocarbamol  500 mg Oral BID   pantoprazole (PROTONIX) IV  40 mg Intravenous Q12H   rosuvastatin  20 mg Oral Daily   sodium chloride flush  3 mL Intravenous Q12H   Continuous Infusions:  sodium chloride 20 mL/hr at 05/19/22 1025   sodium chloride     PRN Meds:.sodium chloride, acetaminophen, calcium carbonate, fluticasone, hydrALAZINE, morphine injection, ondansetron (ZOFRAN) IV, oxyCODONE, sodium chloride flush   Assessment: Principal Problem:   Diarrhea Active Problems:   Depression   Hypothyroid   Acute blood loss anemia   GI bleeding   Hyperlipidemia   HTN (hypertension)   BPH (benign prostatic hyperplasia)   Brett Ray 88 y.o. male with h/o nsaid use admitted with iron deficiency anemia, EGD showed no gross abnormality  , fresh blood coming out of the TI on colonoscopy ,  CT angiogram showed positive for bleed in terminal ileum and then underwent embolization by vascular surgery .  Hb stable since   Plan: Monitor CBC and transfuse as needed, likely his fatigue is from lack of sleep vs inflammatory response of colon from embolization, serial abdominal exam.  IV iron , replace low b12  either orally at 1000 mcg per day or IM which was around 200  As an outpatient would recommend capsule study and may need to repeat colonoscopy since was filled with dark blood.  Avoid all nsaids.   LOS: 3 days   Brett Bellows, MD 05/20/2022, 8:12 AM

## 2022-05-21 DIAGNOSIS — D649 Anemia, unspecified: Secondary | ICD-10-CM | POA: Diagnosis not present

## 2022-05-21 LAB — BASIC METABOLIC PANEL
Anion gap: 5 (ref 5–15)
BUN: <5 mg/dL — ABNORMAL LOW (ref 8–23)
CO2: 26 mmol/L (ref 22–32)
Calcium: 7.8 mg/dL — ABNORMAL LOW (ref 8.9–10.3)
Chloride: 102 mmol/L (ref 98–111)
Creatinine, Ser: 0.59 mg/dL — ABNORMAL LOW (ref 0.61–1.24)
GFR, Estimated: >60 mL/min (ref >60)
Glucose, Bld: 141 mg/dL — ABNORMAL HIGH (ref 70–99)
Potassium: 3.1 mmol/L — ABNORMAL LOW (ref 3.5–5.1)
Sodium: 133 mmol/L — ABNORMAL LOW (ref 135–145)

## 2022-05-21 LAB — CBC
HCT: 20.8 % — ABNORMAL LOW (ref 39.0–52.0)
Hemoglobin: 7.2 g/dL — ABNORMAL LOW (ref 13.0–17.0)
MCH: 30.3 pg (ref 26.0–34.0)
MCHC: 34.6 g/dL (ref 30.0–36.0)
MCV: 87.4 fL (ref 80.0–100.0)
Platelets: 184 10*3/uL (ref 150–400)
RBC: 2.38 MIL/uL — ABNORMAL LOW (ref 4.22–5.81)
RDW: 16.2 % — ABNORMAL HIGH (ref 11.5–15.5)
WBC: 16.6 10*3/uL — ABNORMAL HIGH (ref 4.0–10.5)
nRBC: 0.2 % (ref 0.0–0.2)

## 2022-05-21 LAB — CBC WITH DIFFERENTIAL/PLATELET
Abs Immature Granulocytes: 0.13 10*3/uL — ABNORMAL HIGH (ref 0.00–0.07)
Basophils Absolute: 0 10*3/uL (ref 0.0–0.1)
Basophils Relative: 0 %
Eosinophils Absolute: 0.1 10*3/uL (ref 0.0–0.5)
Eosinophils Relative: 0 %
HCT: 22.5 % — ABNORMAL LOW (ref 39.0–52.0)
Hemoglobin: 7.8 g/dL — ABNORMAL LOW (ref 13.0–17.0)
Immature Granulocytes: 1 %
Lymphocytes Relative: 5 %
Lymphs Abs: 1 10*3/uL (ref 0.7–4.0)
MCH: 31.2 pg (ref 26.0–34.0)
MCHC: 34.7 g/dL (ref 30.0–36.0)
MCV: 90 fL (ref 80.0–100.0)
Monocytes Absolute: 0.9 10*3/uL (ref 0.1–1.0)
Monocytes Relative: 5 %
Neutro Abs: 17.7 10*3/uL — ABNORMAL HIGH (ref 1.7–7.7)
Neutrophils Relative %: 89 %
Platelets: 215 10*3/uL (ref 150–400)
RBC: 2.5 MIL/uL — ABNORMAL LOW (ref 4.22–5.81)
RDW: 16.4 % — ABNORMAL HIGH (ref 11.5–15.5)
WBC: 19.9 10*3/uL — ABNORMAL HIGH (ref 4.0–10.5)
nRBC: 0.3 % — ABNORMAL HIGH (ref 0.0–0.2)

## 2022-05-21 LAB — HEMOGLOBIN AND HEMATOCRIT, BLOOD
HCT: 23 % — ABNORMAL LOW (ref 39.0–52.0)
Hemoglobin: 7.8 g/dL — ABNORMAL LOW (ref 13.0–17.0)

## 2022-05-21 MED ORDER — PANTOPRAZOLE SODIUM 40 MG PO TBEC
40.0000 mg | DELAYED_RELEASE_TABLET | Freq: Two times a day (BID) | ORAL | Status: DC
  Administered 2022-05-21 – 2022-05-22 (×3): 40 mg via ORAL
  Filled 2022-05-21 (×3): qty 1

## 2022-05-21 NOTE — Evaluation (Signed)
Physical Therapy Evaluation Patient Details Name: Brett Ray MRN: [id number] DOB: 1930/12/14 Today's Date: 05/21/2022  History of Present Illness  Pt is an 88 year old gentleman with history of hypertension, hyperlipidemia, hypothyroidism, mitral valve regurgitation, BPH presented to the emergency room with diarrhea and dark stool.  MD assessment includes acute blood loss anemia with pt now s/p mesenteric embolization for bleeding on 05/19/22.   Clinical Impression  Pt was pleasant and motivated to participate during the session and put forth good effort throughout. Pt required extra time and effort with bed mobility and transfers but no physical assistance.  Pt able to amb 20 feet x 1 with SpO2 and HR WNL with only mild dizziness.  Pt then attempted a second walk but after 10 feet was so dizzy that he needed to sit down to chair set up by pt's door and could not make it back to his recliner.  BP taken immediately upon sitting at 112/98 compared to recent supine BP of 112/64.  Pt's SpO2 and HR WNL throughout on room air.  After sitting for several minutes pt was able to amb 10 feet back to recliner with only mild dizziness.  Pt did report feeling weaker than baseline and reported his initial 20 foot walk as "medium" difficulty from an effort perspective.  Pt will benefit from PT services in a SNF setting upon discharge to safely address deficits listed in patient problem list for decreased caregiver assistance and eventual return to PLOF.         Recommendations for follow up therapy are one component of a multi-disciplinary discharge planning process, led by the attending physician.  Recommendations may be updated based on patient status, additional functional criteria and insurance authorization.  Follow Up Recommendations Skilled nursing-short term rehab (<3 hours/day) Can patient physically be transported by private vehicle: Yes    Assistance Recommended at Discharge Frequent or constant  Supervision/Assistance  Patient can return home with the following  A little help with walking and/or transfers;A little help with bathing/dressing/bathroom;Assistance with cooking/housework;Assist for transportation    Equipment Recommendations Rolling walker (2 wheels)  Recommendations for Other Services       Functional Status Assessment Patient has had a recent decline in their functional status and demonstrates the ability to make significant improvements in function in a reasonable and predictable amount of time.     Precautions / Restrictions Precautions Precautions: Fall Restrictions Weight Bearing Restrictions: No      Mobility  Bed Mobility Overal bed mobility: Modified Independent             General bed mobility comments: Min extra time and effort only    Transfers Overall transfer level: Needs assistance Equipment used: Rolling walker (2 wheels) Transfers: Sit to/from Stand Sit to Stand: Supervision           General transfer comment: Fair eccentric and concentric control and stability    Ambulation/Gait Ambulation/Gait assistance: Min guard Gait Distance (Feet): 20 Feet x 1, 10 feet x 2 Assistive device: Rolling walker (2 wheels) Gait Pattern/deviations: Step-through pattern, Decreased step length - right, Decreased step length - left Gait velocity: decreased     General Gait Details: Slow cadence but steady without LOB; significant dizziness with second amb of 10 feet requiring patient to sit with BP, SpO2 and HR WNL  Stairs            Wheelchair Mobility    Modified Rankin (Stroke Patients Only)       Balance Overall balance assessment:  Needs assistance   Sitting balance-Leahy Scale: Normal     Standing balance support: Bilateral upper extremity supported, During functional activity Standing balance-Leahy Scale: Good                               Pertinent Vitals/Pain Pain Assessment Pain Assessment:  No/denies pain    Home Living Family/patient expects to be discharged to:: Private residence Living Arrangements: Spouse/significant other Available Help at Discharge: Family;Available 24 hours/day Type of Home: Independent living facility Home Access: Level entry       Home Layout: One level Home Equipment: Cane - single point Additional Comments: 3WW    Prior Function Prior Level of Function : Independent/Modified Independent             Mobility Comments: Ind amb community distances without an AD, no fall history, exercises daily ADLs Comments: Ind with ADLs     Hand Dominance        Extremity/Trunk Assessment   Upper Extremity Assessment Upper Extremity Assessment: Generalized weakness    Lower Extremity Assessment Lower Extremity Assessment: Generalized weakness       Communication   Communication: No difficulties  Cognition Arousal/Alertness: Awake/alert Behavior During Therapy: WFL for tasks assessed/performed Overall Cognitive Status: Within Functional Limits for tasks assessed                                          General Comments      Exercises Total Joint Exercises Ankle Circles/Pumps: Strengthening, Both, 5 reps, 10 reps Long Arc Quad: Strengthening, Both, 5 reps, 10 reps Knee Flexion: Strengthening, Both, 10 reps, 5 reps Marching in Standing: Strengthening, Both, 5 reps, 10 reps, Standing Other Exercises Other Exercises: Standing mini squats 2 x 5 Other Exercises: Standing heel raises x 10   Assessment/Plan    PT Assessment Patient needs continued PT services  PT Problem List Decreased strength;Decreased activity tolerance;Decreased mobility;Decreased knowledge of use of DME       PT Treatment Interventions DME instruction;Gait training;Functional mobility training;Therapeutic activities;Therapeutic exercise;Balance training;Patient/family education    PT Goals (Current goals can be found in the Care Plan section)   Acute Rehab PT Goals Patient Stated Goal: To get stronger PT Goal Formulation: With patient Time For Goal Achievement: 06/03/22 Potential to Achieve Goals: Good    Frequency Min 2X/week     Co-evaluation               AM-PAC PT "6 Clicks" Mobility  Outcome Measure Help needed turning from your back to your side while in a flat bed without using bedrails?: None Help needed moving from lying on your back to sitting on the side of a flat bed without using bedrails?: None Help needed moving to and from a bed to a chair (including a wheelchair)?: A Little Help needed standing up from a chair using your arms (e.g., wheelchair or bedside chair)?: A Little Help needed to walk in hospital room?: A Little Help needed climbing 3-5 steps with a railing? : A Little 6 Click Score: 20    End of Session Equipment Utilized During Treatment: Gait belt Activity Tolerance: Other (comment) (limited by dizziness with gait) Patient left: in chair;with call bell/phone within reach;with chair alarm set;with family/visitor present Nurse Communication: Mobility status;Other (comment) (Dizziness with amb, chair alarm not functioning) PT Visit Diagnosis: Difficulty in  walking, not elsewhere classified (R26.2);Muscle weakness (generalized) (M62.81)    Time: 1783-7542 PT Time Calculation (min) (ACUTE ONLY): 38 min   Charges:   PT Evaluation $PT Eval Moderate Complexity: 1 Mod PT Treatments $Therapeutic Exercise: 8-22 mins        D. Scott Cola Gane PT, DPT 05/21/22, 11:10 AM

## 2022-05-21 NOTE — Progress Notes (Signed)
PROGRESS NOTE    Brett Ray  AJG:[id number] DOB: 10-Nov-1931 DOA: 05/17/2022 PCP: Derinda Late, MD    Brief Narrative:  88 year old gentleman with history of hypertension, hyperlipidemia, hypothyroidism, mitral valve regurgitation, BPH presented to the emergency room with diarrhea and dark stool.  In the emergency room hemodynamically stable.  Hemoglobin 7.6 from recent hemoglobin of 12.  FOBT positive.  Blood pressure stable.  CTA of the abdomen pelvis negative for acute bleeding.  Admitted with acute GI bleeding. EGD and colonoscopy with no source of bleeding, bleeding from the small intestine. CT angiogram with bleeding from hepatic flexure area Selective angiogram and coil embolization small bowel by vascular surgery.   Assessment & Plan:   Acute blood loss anemia , symptomatic anemia, low ferritin: Received total 4 units of PRBC and 1 unit of iron.  Hemoglobin 7.7-7.2. EGD nonrevealing. Colonoscopy with bleeding from terminal ileum. With ongoing bleeding, underwent CT angiogram and found to have source of bleeding through small bowel. Vascular surgery, interventional with coil embolization SMA. Still having some rectal bleeding but is stabilizing. We will allow regular diet. GI and surgery following.  Transfuse with aspirin 7.  Recheck today afternoon. After vascular procedure, he had tenderness and leukocytosis, started on Unasyn and gentle IV fluids.  Stabilizing.  Hypokalemia: IV fluids with potassium.  Recheck tomorrow morning.  Check magnesium and phosphorus.  Essential hypertension: Blood pressure stable.  Holding antihypertensives.  Hypothyroidism: On Synthroid.  Depression on Celexa.  Vitamin B12 deficiency: Received 1 dose of vitamin B12 injection.  Will discharge on oral supplementation.  Mobilize with PT OT.  Anticipate more blood transfusions.  Changed to oral Protonix.  DVT prophylaxis: SCDs Start: 05/17/22 0957   Code Status: Full code Family  Communication: Wife at the bedside Disposition Plan: Status is: Inpatient Remains inpatient appropriate because: Active GI bleeding.       Consultants:  Gastroenterology Vascular surgery  Procedures:  EGD colonoscopy Angiogram and coil embolization  Antimicrobials:  None   Subjective:  Seen and examined.  Denies any abdomen pain nausea vomiting.  Wants to eat real food. He had 4 bowel movements since yesterday, 3 of them had fresh blood but is small in amount.  Objective: Vitals:   05/20/22 0833 05/20/22 2030 05/21/22 0654 05/21/22 0750  BP: (!) 157/66 (!) 166/73 138/66 112/62  Pulse: 66 70 68 73  Resp: '18 18 16 18  '$ Temp: 98.1 F (36.7 C) 98.7 F (37.1 C) 98.3 F (36.8 C) 100.2 F (37.9 C)  TempSrc: Oral Oral  Oral  SpO2: 97% 99% 98% 99%  Weight:      Height:        Intake/Output Summary (Last 24 hours) at 05/21/2022 1258 Last data filed at 05/21/2022 3559 Gross per 24 hour  Intake 1402.58 ml  Output 500 ml  Net 902.58 ml   Filed Weights   05/17/22 0644  Weight: 70.8 kg    Examination:  General exam: Appears calm and comfortable, full of sense of humor. Respiratory system: No added sounds. Cardiovascular system: S1 & S2 heard, RRR. No pedal edema. Gastrointestinal system: Soft.  Nontender.  No rigidity or guarding. Central nervous system: Alert and oriented. No focal neurological deficits. Extremities: Symmetric 5 x 5 power.    Data Reviewed: I have personally reviewed following labs and imaging studies  CBC: Recent Labs  Lab 05/17/22 0649 05/17/22 1639 05/17/22 2134 05/18/22 0413 05/18/22 1035 05/18/22 1701 05/19/22 2213 05/20/22 0513 05/20/22 1049 05/20/22 1931 05/21/22 0719  WBC 8.9   < >  6.9 7.7 6.3  --   --   --  18.7*  --  16.6*  NEUTROABS 6.9  --   --   --   --   --   --   --   --   --   --   HGB 7.6*   < > 6.0* 6.5* 7.8*   < > 7.7* 7.7* 7.9* 8.1* 7.2*  HCT 22.7*   < > 17.6* 19.3* 22.4*   < > 22.9* 22.2* 22.4* 23.0* 20.8*  MCV 93.0    < > 93.6 93.2 88.5  --   --   --  86.8  --  87.4  PLT 199   < > 164 154 145*  --   --   --  176  --  184   < > = values in this interval not displayed.   Basic Metabolic Panel: Recent Labs  Lab 05/17/22 0649 05/21/22 0719  NA 131* 133*  K 4.1 3.1*  CL 101 102  CO2 22 26  GLUCOSE 154* 141*  BUN 25* <5*  CREATININE 0.83 0.59*  CALCIUM 8.4* 7.8*   GFR: Estimated Creatinine Clearance: 62.6 mL/min (A) (by C-G formula based on SCr of 0.59 mg/dL (L)). Liver Function Tests: Recent Labs  Lab 05/17/22 0649  AST 21  ALT 16  ALKPHOS 29*  BILITOT 0.7  PROT 5.4*  ALBUMIN 3.6   Recent Labs  Lab 05/17/22 0649  LIPASE 45   No results for input(s): "AMMONIA" in the last 168 hours. Coagulation Profile: Recent Labs  Lab 05/17/22 1639  INR 1.2   Cardiac Enzymes: No results for input(s): "CKTOTAL", "CKMB", "CKMBINDEX", "TROPONINI" in the last 168 hours. BNP (last 3 results) No results for input(s): "PROBNP" in the last 8760 hours. HbA1C: No results for input(s): "HGBA1C" in the last 72 hours. CBG: Recent Labs  Lab 05/18/22 0807 05/19/22 0800 05/19/22 2316 05/20/22 0834 05/20/22 1227  GLUCAP 106* 103* 100* 142* 179*   Lipid Profile: No results for input(s): "CHOL", "HDL", "LDLCALC", "TRIG", "CHOLHDL", "LDLDIRECT" in the last 72 hours. Thyroid Function Tests: No results for input(s): "TSH", "T4TOTAL", "FREET4", "T3FREE", "THYROIDAB" in the last 72 hours. Anemia Panel: No results for input(s): "VITAMINB12", "FOLATE", "FERRITIN", "TIBC", "IRON", "RETICCTPCT" in the last 72 hours.  Sepsis Labs: No results for input(s): "PROCALCITON", "LATICACIDVEN" in the last 168 hours.  No results found for this or any previous visit (from the past 240 hour(s)).       Radiology Studies: DG C-Arm 1-60 Min-No Report  Result Date: 05/19/2022 Fluoroscopy was utilized by the requesting physician.  No radiographic interpretation.   PERIPHERAL VASCULAR CATHETERIZATION  Result Date:  05/19/2022 See surgical note for result.       Scheduled Meds:  acidophilus  1 capsule Oral Daily   vitamin C  500 mg Oral Daily   citalopram  10 mg Oral Daily   ferrous sulfate  325 mg Oral Q breakfast   finasteride  5 mg Oral Daily   latanoprost  1 drop Both Eyes QHS   levothyroxine  137 mcg Oral QAC breakfast   methocarbamol  500 mg Oral BID   pantoprazole  40 mg Oral BID   rosuvastatin  20 mg Oral Daily   sodium chloride flush  3 mL Intravenous Q12H   Continuous Infusions:  sodium chloride 20 mL/hr at 05/19/22 1025   sodium chloride     ampicillin-sulbactam (UNASYN) IV 3 g (05/21/22 1053)   dextrose 5 % and 0.45 % NaCl  with KCl 20 mEq/L 75 mL/hr at 05/21/22 1051     LOS: 4 days    Time spent: 35 minutes    Barb Merino, MD [facility] Pager [phone]

## 2022-05-21 NOTE — NC FL2 (Signed)
[place] LEVEL OF CARE SCREENING TOOL     IDENTIFICATION  Patient Name: Brett Ray Birthdate: Dec 03, 1930 Sex: male Admission Date (Current Location): 05/17/2022  [facility] and [place] Number:  Engineering geologist and Address:  [facility], [address], [place], [place] [address]      Provider Number: [id number]  Attending Physician Name and Address:  Barb Merino, MD  Relative Name and Phone Number:  Mishon, Blubaugh [facility]   [phone] [facility] Phone)    Current Level of Care: Hospital Recommended Level of Care: [place] Prior Approval Number:    Date Approved/Denied:   PASRR Number: [id number]  Discharge Plan: SNF    Current Diagnoses: Patient Active Problem List   Diagnosis Date Noted   Diarrhea 05/17/2022   Acute blood loss anemia 05/17/2022   GI bleeding 05/17/2022   Hyperlipidemia    HTN (hypertension)    BPH (benign prostatic hyperplasia)    Orthostasis 10/01/2018   Bradycardia 10/01/2018   Depression 09/30/2018   Hypothyroid 09/30/2018   Syncope 09/22/2018    Orientation RESPIRATION BLADDER Height & Weight     Self, Time, Place  Normal Continent Weight: 70.8 kg Height:  '5\' 9"'$  (175.3 cm)  BEHAVIORAL SYMPTOMS/MOOD NEUROLOGICAL BOWEL NUTRITION STATUS      Continent Diet  AMBULATORY STATUS COMMUNICATION OF NEEDS Skin   Limited Assist Verbally Other (Comment) (Skin Tears RIGHT arm and LEFT knee)                       Personal Care Assistance Level of Assistance  Bathing, Feeding, Dressing Bathing Assistance: Maximum assistance Feeding assistance: Limited assistance Dressing Assistance: Limited assistance     Functional Limitations Info  Sight Sight Info: Impaired (Glasses)        SPECIAL CARE FACTORS FREQUENCY  OT (By licensed OT), PT (By licensed PT)     PT Frequency: 5x/week OT Frequency: 5x/week            Contractures Contractures Info: Not present    Additional  Factors Info  Code Status Code Status Info: Full Code             Current Medications (05/21/2022):  This is the current hospital active medication list Current Facility-Administered Medications  Medication Dose Route Frequency Provider Last Rate Last Admin   0.9 %  sodium chloride infusion   Intravenous Continuous Schnier, Dolores Lory, MD 20 mL/hr at 05/19/22 1025 Continued from Pre-op at 05/19/22 1025   0.9 %  sodium chloride infusion  250 mL Intravenous PRN Schnier, Dolores Lory, MD       acetaminophen (TYLENOL) tablet 650 mg  650 mg Oral Q4H PRN Schnier, Dolores Lory, MD       acidophilus (RISAQUAD) capsule 1 capsule  1 capsule Oral Daily Schnier, Dolores Lory, MD   1 capsule at 05/21/22 0824   Ampicillin-Sulbactam (UNASYN) 3 g in sodium chloride 0.9 % 100 mL IVPB  3 g Intravenous Q8H Schnier, Dolores Lory, MD 200 mL/hr at 05/21/22 1053 3 g at 05/21/22 1053   ascorbic acid (VITAMIN C) tablet 500 mg  500 mg Oral Daily Schnier, Dolores Lory, MD   500 mg at 05/21/22 4944   calcium carbonate (TUMS - dosed in mg elemental calcium) chewable tablet 200 mg of elemental calcium  1 tablet Oral BID PRN Schnier, Dolores Lory, MD   200 mg of elemental calcium at 05/17/22 2149   citalopram (CELEXA) tablet 10 mg  10 mg Oral  Daily Schnier, Dolores Lory, MD   10 mg at 05/21/22 0824   dextrose 5 % and 0.45 % NaCl with KCl 20 mEq/L infusion   Intravenous Continuous Schnier, Dolores Lory, MD 75 mL/hr at 05/21/22 1051 New Bag at 05/21/22 1051   ferrous sulfate tablet 325 mg  325 mg Oral Q breakfast Schnier, Dolores Lory, MD   325 mg at 05/21/22 3474   finasteride (PROSCAR) tablet 5 mg  5 mg Oral Daily Schnier, Dolores Lory, MD   5 mg at 05/21/22 0824   fluticasone (FLONASE) 50 MCG/ACT nasal spray 2 spray  2 spray Each Nare Daily PRN Schnier, Dolores Lory, MD       hydrALAZINE (APRESOLINE) tablet 25 mg  25 mg Oral Q8H PRN Schnier, Dolores Lory, MD       latanoprost (XALATAN) 0.005 % ophthalmic solution 1 drop  1 drop Both Eyes QHS Schnier, Dolores Lory, MD   1 drop at 05/19/22 2359   levothyroxine (SYNTHROID) tablet 137 mcg  137 mcg Oral QAC breakfast Katha Cabal, MD   137 mcg at 05/20/22 2595   methocarbamol (ROBAXIN) tablet 500 mg  500 mg Oral BID Katha Cabal, MD   500 mg at 05/21/22 0824   morphine (PF) 4 MG/ML injection 2 mg  2 mg Intravenous Q1H PRN Schnier, Dolores Lory, MD       ondansetron [facility]) injection 4 mg  4 mg Intravenous Q6H PRN Schnier, Dolores Lory, MD   4 mg at 05/19/22 2358   oxyCODONE (Oxy IR/ROXICODONE) immediate release tablet 5-10 mg  5-10 mg Oral Q4H PRN Schnier, Dolores Lory, MD       pantoprazole (PROTONIX) EC tablet 40 mg  40 mg Oral BID Dorothe Pea, RPH   40 mg at 05/21/22 6387   rosuvastatin (CRESTOR) tablet 20 mg  20 mg Oral Daily Schnier, Dolores Lory, MD   20 mg at 05/21/22 0824   sodium chloride flush (NS) 0.9 % injection 3 mL  3 mL Intravenous Q12H Schnier, Dolores Lory, MD   3 mL at 05/21/22 0827   sodium chloride flush (NS) 0.9 % injection 3 mL  3 mL Intravenous PRN Schnier, Dolores Lory, MD         Discharge Medications: Please see discharge summary for a list of discharge medications.  Relevant Imaging Results:  Relevant Lab Results:   Additional Information SS# [id number]  Izola Price, RN

## 2022-05-21 NOTE — Progress Notes (Signed)
Inpatient Follow-up/Progress Note   Patient ID: Brett Ray is a 88 y.o. male.  Overnight Events / Subjective Findings Patient accompanied by wife at bedside. Resting in bed comfortably. Symptomatically feels much better. No abdominal pain/n/v or fever. Has not noticed any BRBPR. Hgb slightly lower today- suspect equilibration/ivf resuscitation as global drop in lines. Repeat hgb later today Tolerating po- had [place] for breakfast. No other acute gi complaints.  Review of Systems  Constitutional:  Negative for activity change, appetite change, fatigue and fever.  HENT:  Negative for trouble swallowing.   Gastrointestinal:  Positive for blood in stool. Negative for abdominal distention, abdominal pain, constipation, diarrhea, nausea and vomiting.  Neurological:  Negative for weakness and headaches.     Medications  Current Facility-Administered Medications:    0.9 %  sodium chloride infusion, , Intravenous, Continuous, Schnier, Dolores Lory, MD, Last Rate: 20 mL/hr at 05/19/22 1025, Continued from Pre-op at 05/19/22 1025   0.9 %  sodium chloride infusion, 250 mL, Intravenous, PRN, Schnier, Dolores Lory, MD   acetaminophen (TYLENOL) tablet 650 mg, 650 mg, Oral, Q4H PRN, Schnier, Dolores Lory, MD   acidophilus (RISAQUAD) capsule 1 capsule, 1 capsule, Oral, Daily, Schnier, Dolores Lory, MD, 1 capsule at 05/21/22 0824   Ampicillin-Sulbactam (UNASYN) 3 g in sodium chloride 0.9 % 100 mL IVPB, 3 g, Intravenous, Q8H, Schnier, Dolores Lory, MD, Last Rate: 200 mL/hr at 05/21/22 1053, 3 g at 05/21/22 1053   ascorbic acid (VITAMIN C) tablet 500 mg, 500 mg, Oral, Daily, Schnier, Dolores Lory, MD, 500 mg at 05/21/22 2409   calcium carbonate (TUMS - dosed in mg elemental calcium) chewable tablet 200 mg of elemental calcium, 1 tablet, Oral, BID PRN, Schnier, Dolores Lory, MD, 200 mg of elemental calcium at 05/17/22 2149   citalopram (CELEXA) tablet 10 mg, 10 mg, Oral, Daily, Schnier, Dolores Lory, MD, 10 mg at 05/21/22  0824   dextrose 5 % and 0.45 % NaCl with KCl 20 mEq/L infusion, , Intravenous, Continuous, Schnier, Dolores Lory, MD, Last Rate: 75 mL/hr at 05/21/22 1051, New Bag at 05/21/22 1051   ferrous sulfate tablet 325 mg, 325 mg, Oral, Q breakfast, Schnier, Dolores Lory, MD, 325 mg at 05/21/22 0824   finasteride (PROSCAR) tablet 5 mg, 5 mg, Oral, Daily, Schnier, Dolores Lory, MD, 5 mg at 05/21/22 0824   fluticasone (FLONASE) 50 MCG/ACT nasal spray 2 spray, 2 spray, Each Nare, Daily PRN, Schnier, Dolores Lory, MD   hydrALAZINE (APRESOLINE) tablet 25 mg, 25 mg, Oral, Q8H PRN, Schnier, Dolores Lory, MD   latanoprost (XALATAN) 0.005 % ophthalmic solution 1 drop, 1 drop, Both Eyes, QHS, Schnier, Dolores Lory, MD, 1 drop at 05/19/22 2359   levothyroxine (SYNTHROID) tablet 137 mcg, 137 mcg, Oral, QAC breakfast, Schnier, Dolores Lory, MD, 137 mcg at 05/20/22 0513   methocarbamol (ROBAXIN) tablet 500 mg, 500 mg, Oral, BID, Schnier, Dolores Lory, MD, 500 mg at 05/21/22 7353   morphine (PF) 4 MG/ML injection 2 mg, 2 mg, Intravenous, Q1H PRN, Schnier, Dolores Lory, MD   ondansetron [facility] injection 4 mg, 4 mg, Intravenous, Q6H PRN, Schnier, Dolores Lory, MD, 4 mg at 05/19/22 2358   oxyCODONE (Oxy IR/ROXICODONE) immediate release tablet 5-10 mg, 5-10 mg, Oral, Q4H PRN, Schnier, Dolores Lory, MD   pantoprazole (PROTONIX) EC tablet 40 mg, 40 mg, Oral, BID, Dorothe Pea, RPH, 40 mg at 05/21/22 2992   rosuvastatin (CRESTOR) tablet 20 mg, 20 mg, Oral, Daily, Schnier, Dolores Lory, MD, 20 mg at 05/21/22 [phone]  sodium chloride flush (NS) 0.9 % injection 3 mL, 3 mL, Intravenous, Q12H, Schnier, Dolores Lory, MD, 3 mL at 05/21/22 0827   sodium chloride flush (NS) 0.9 % injection 3 mL, 3 mL, Intravenous, PRN, Schnier, Dolores Lory, MD  sodium chloride 20 mL/hr at 05/19/22 1025   sodium chloride     ampicillin-sulbactam (UNASYN) IV 3 g (05/21/22 1053)   dextrose 5 % and 0.45 % NaCl with KCl 20 mEq/L 75 mL/hr at 05/21/22 1051    sodium chloride, acetaminophen,  calcium carbonate, fluticasone, hydrALAZINE, morphine injection, ondansetron (ZOFRAN) IV, oxyCODONE, sodium chloride flush   Objective    Vitals:   05/20/22 0833 05/20/22 2030 05/21/22 0654 05/21/22 0750  BP: (!) 157/66 (!) 166/73 138/66 112/62  Pulse: 66 70 68 73  Resp: '18 18 16 18  '$ Temp: 98.1 F (36.7 C) 98.7 F (37.1 C) 98.3 F (36.8 C) 100.2 F (37.9 C)  TempSrc: Oral Oral  Oral  SpO2: 97% 99% 98% 99%  Weight:      Height:         Physical Exam Vitals and nursing note reviewed.  Constitutional:      General: He is not in acute distress.    Appearance: Normal appearance. He is not ill-appearing, toxic-appearing or diaphoretic.  HENT:     Head: Normocephalic and atraumatic.     Nose: Nose normal.     Mouth/Throat:     Mouth: Mucous membranes are moist.     Pharynx: Oropharynx is clear.  Eyes:     General: No scleral icterus.    Extraocular Movements: Extraocular movements intact.  Cardiovascular:     Rate and Rhythm: Normal rate and regular rhythm.     Heart sounds: Normal heart sounds. No murmur heard.    No friction rub. No gallop.  Pulmonary:     Effort: Pulmonary effort is normal. No respiratory distress.     Breath sounds: Normal breath sounds. No wheezing, rhonchi or rales.  Abdominal:     General: Bowel sounds are normal. There is no distension.     Palpations: Abdomen is soft.     Tenderness: There is no abdominal tenderness. There is no guarding or rebound.  Musculoskeletal:     Cervical back: Neck supple.     Right lower leg: No edema.     Left lower leg: No edema.  Skin:    General: Skin is warm and dry.     Coloration: Skin is not jaundiced or pale.  Neurological:     General: No focal deficit present.     Mental Status: He is alert and oriented to person, place, and time. Mental status is at baseline.  Psychiatric:        Mood and Affect: Mood normal.        Behavior: Behavior normal.        Thought Content: Thought content normal.         Judgment: Judgment normal.      Laboratory Data Recent Labs  Lab 05/17/22 0649 05/17/22 1639 05/20/22 1049 05/20/22 1931 05/21/22 0719 05/21/22 1248  WBC 8.9   < > 18.7*  --  16.6* 19.9*  HGB 7.6*   < > 7.9* 8.1* 7.2* 7.8*  7.8*  HCT 22.7*   < > 22.4* 23.0* 20.8* 22.5*  23.0*  PLT 199   < > 176  --  184 215  NEUTOPHILPCT 77  --   --   --   --  89  LYMPHOPCT 14  --   --   --   --  5  MONOPCT 7  --   --   --   --  5  EOSPCT 1  --   --   --   --  0   < > = values in this interval not displayed.   Recent Labs  Lab 05/17/22 0649 05/21/22 0719  NA 131* 133*  K 4.1 3.1*  CL 101 102  CO2 22 26  BUN 25* <5*  CREATININE 0.83 0.59*  CALCIUM 8.4* 7.8*  PROT 5.4*  --   BILITOT 0.7  --   ALKPHOS 29*  --   ALT 16  --   AST 21  --   GLUCOSE 154* 141*   Recent Labs  Lab 05/17/22 1639  INR 1.2      Imaging Studies: DG C-Arm 1-60 Min-No Report  Result Date: 05/19/2022 Fluoroscopy was utilized by the requesting physician.  No radiographic interpretation.   PERIPHERAL VASCULAR CATHETERIZATION  Result Date: 05/19/2022 See surgical note for result.   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  # diarrhea  Plan:  Symptomatically greatly improved No abdominal pain Recheck hgb No longer having frequent BM- suspect bleeding source has resolved Will need repeat outpatient VCE/Colonoscopy Continue to monitor white count  Monitor h&h; transfusion and resuscitation as per primary team Iv iron infusion, b12 replacement  Nsaid avoidance Supportive care as per primary team  Advance diet as tolerated  I personally performed the service.  Management of other medical comorbidities as per primary team  Thank you for allowing [place] to participate in this patient's care. Please  don't hesitate to call if any questions or concerns arise.   Annamaria Helling, DO [facility]  Portions of the record may have been created with voice recognition software. Occasional wrong-word or 'sound-a-like' substitutions may have occurred due to the inherent limitations of voice recognition software.  Read the chart carefully and recognize, using context, where substitutions may have occurred.

## 2022-05-21 NOTE — Progress Notes (Signed)
Brett Ray is postprocedural day #2 from GI embolization on 6 July.  Hemoglobins have been stable, white count had climbed yesterday, was placed on Unasyn and hydration yesterday evening, white count is now 16.6, down from 18.7.  He says he feels like a new man.  He has no focal abdominal complaints.  He has had no further GI bleeding by report.  At this point we will continue with the Unasyn and hydration, although he is taking more p.o.  We will tentatively plan on letting him go tomorrow morning.  All questions were answered to he and his wife, discussed as well with Dr. Sloan Leiter.

## 2022-05-21 NOTE — TOC Progression Note (Signed)
Transition of Care [facility]) - Progression Note    Patient Details  Name: Brett Ray MRN: [id number] Date of Birth: February 06, 1931  Transition of Care [facility]) CM/SW Contact  Izola Price, RN Phone Number: 05/21/2022, 4:03 PM  Clinical Narrative: 7/8: New PT recommendations for STR/SNF. Patient is from [facility] at [place]. PASRR and FL2 sent via HUB to [facility] at [facility] and left VM on [facility] phone to call RN CM back. Simmie Davies RN CM            Expected Discharge Plan and Services                                                 Social Determinants of Health (SDOH) Interventions    Readmission Risk Interventions     No data to display

## 2022-05-22 DIAGNOSIS — K284 Chronic or unspecified gastrojejunal ulcer with hemorrhage: Secondary | ICD-10-CM

## 2022-05-22 DIAGNOSIS — D62 Acute posthemorrhagic anemia: Secondary | ICD-10-CM | POA: Diagnosis not present

## 2022-05-22 LAB — CBC WITH DIFFERENTIAL/PLATELET
Abs Immature Granulocytes: 0.11 10*3/uL — ABNORMAL HIGH (ref 0.00–0.07)
Basophils Absolute: 0 10*3/uL (ref 0.0–0.1)
Basophils Relative: 0 %
Eosinophils Absolute: 0.2 10*3/uL (ref 0.0–0.5)
Eosinophils Relative: 2 %
HCT: 20.5 % — ABNORMAL LOW (ref 39.0–52.0)
Hemoglobin: 6.9 g/dL — ABNORMAL LOW (ref 13.0–17.0)
Immature Granulocytes: 1 %
Lymphocytes Relative: 8 %
Lymphs Abs: 1.1 10*3/uL (ref 0.7–4.0)
MCH: 30.3 pg (ref 26.0–34.0)
MCHC: 33.7 g/dL (ref 30.0–36.0)
MCV: 89.9 fL (ref 80.0–100.0)
Monocytes Absolute: 0.9 10*3/uL (ref 0.1–1.0)
Monocytes Relative: 6 %
Neutro Abs: 11.5 10*3/uL — ABNORMAL HIGH (ref 1.7–7.7)
Neutrophils Relative %: 83 %
Platelets: 210 10*3/uL (ref 150–400)
RBC: 2.28 MIL/uL — ABNORMAL LOW (ref 4.22–5.81)
RDW: 16.6 % — ABNORMAL HIGH (ref 11.5–15.5)
WBC: 13.8 10*3/uL — ABNORMAL HIGH (ref 4.0–10.5)
nRBC: 0.1 % (ref 0.0–0.2)

## 2022-05-22 LAB — COMPREHENSIVE METABOLIC PANEL
ALT: 22 U/L (ref 0–44)
AST: 23 U/L (ref 15–41)
Albumin: 2.8 g/dL — ABNORMAL LOW (ref 3.5–5.0)
Alkaline Phosphatase: 35 U/L — ABNORMAL LOW (ref 38–126)
Anion gap: 4 — ABNORMAL LOW (ref 5–15)
BUN: 6 mg/dL — ABNORMAL LOW (ref 8–23)
CO2: 27 mmol/L (ref 22–32)
Calcium: 7.5 mg/dL — ABNORMAL LOW (ref 8.9–10.3)
Chloride: 107 mmol/L (ref 98–111)
Creatinine, Ser: 0.61 mg/dL (ref 0.61–1.24)
GFR, Estimated: >60 mL/min (ref >60)
Glucose, Bld: 114 mg/dL — ABNORMAL HIGH (ref 70–99)
Potassium: 3.4 mmol/L — ABNORMAL LOW (ref 3.5–5.1)
Sodium: 138 mmol/L (ref 135–145)
Total Bilirubin: 0.6 mg/dL (ref 0.3–1.2)
Total Protein: 4.9 g/dL — ABNORMAL LOW (ref 6.5–8.1)

## 2022-05-22 LAB — PHOSPHORUS: Phosphorus: 3.1 mg/dL (ref 2.5–4.6)

## 2022-05-22 LAB — PREPARE RBC (CROSSMATCH)

## 2022-05-22 LAB — GLUCOSE, CAPILLARY: Glucose-Capillary: 110 mg/dL — ABNORMAL HIGH (ref 70–99)

## 2022-05-22 LAB — MAGNESIUM: Magnesium: 2.1 mg/dL (ref 1.7–2.4)

## 2022-05-22 MED ORDER — VITAMIN B-12 1000 MCG PO TABS: 1000.0000 ug | ORAL_TABLET | Freq: Every day | ORAL | 0 refills | Status: DC

## 2022-05-22 MED ORDER — POTASSIUM CHLORIDE CRYS ER 20 MEQ PO TBCR: 20.0000 meq | EXTENDED_RELEASE_TABLET | Freq: Every day | ORAL | 0 refills | Status: DC

## 2022-05-22 MED ORDER — SODIUM CHLORIDE 0.9% IV SOLUTION: Freq: Once | INTRAVENOUS | Status: AC

## 2022-05-22 MED ORDER — SODIUM CHLORIDE 0.9% IV SOLUTION
Freq: Once | INTRAVENOUS | Status: AC
Start: 2022-05-22 — End: 2022-05-22

## 2022-05-22 MED ORDER — FERROUS SULFATE 325 (65 FE) MG PO TABS: 325.0000 mg | ORAL_TABLET | Freq: Every day | ORAL | 0 refills | Status: AC

## 2022-05-22 MED ORDER — OMEPRAZOLE 20 MG PO CPDR: 20.0000 mg | DELAYED_RELEASE_CAPSULE | Freq: Two times a day (BID) | ORAL | 2 refills | Status: AC

## 2022-05-22 NOTE — Progress Notes (Signed)
Inpatient Follow-up/Progress Note   Patient ID: Brett Ray is a 88 y.o. male.  Overnight Events / Subjective Findings NAEON.  Tolerating p.o.  No further bloody stools.  Hemoglobin today is 6.9 however vital signs have remained stable and no signs of GI bleeding.  He is to receive 1 unit of PRBCs prior to discharge today per hospital team. His daughter-in-law is at bedside.  They both understand outpatient follow-up as indicated No other acute GI complaints  Review of Systems  Constitutional:  Negative for activity change, appetite change, fatigue, fever and unexpected weight change.  HENT:  Negative for trouble swallowing.   Respiratory:  Negative for shortness of breath and wheezing.   Cardiovascular:  Negative for chest pain.  Gastrointestinal:  Positive for blood in stool. Negative for abdominal distention, abdominal pain, constipation, diarrhea, nausea and vomiting.  Skin:  Negative for color change.  Neurological:  Positive for light-headedness (upon standing too quickly). Negative for weakness and headaches.     Medications  Current Facility-Administered Medications:    0.9 %  sodium chloride infusion (Manually program via Guardrails IV Fluids), , Intravenous, Once, Ghimire, Kuber, MD   0.9 %  sodium chloride infusion, 250 mL, Intravenous, PRN, Schnier, Dolores Lory, MD   acetaminophen (TYLENOL) tablet 650 mg, 650 mg, Oral, Q4H PRN, Schnier, Dolores Lory, MD   acidophilus (RISAQUAD) capsule 1 capsule, 1 capsule, Oral, Daily, Schnier, Dolores Lory, MD, 1 capsule at 05/22/22 0800   Ampicillin-Sulbactam (UNASYN) 3 g in sodium chloride 0.9 % 100 mL IVPB, 3 g, Intravenous, Q8H, Schnier, Dolores Lory, MD, Last Rate: 200 mL/hr at 05/22/22 0252, 3 g at 05/22/22 1761   ascorbic acid (VITAMIN C) tablet 500 mg, 500 mg, Oral, Daily, Schnier, Dolores Lory, MD, 500 mg at 05/22/22 0800   calcium carbonate (TUMS - dosed in mg elemental calcium) chewable tablet 200 mg of elemental calcium, 1 tablet, Oral,  BID PRN, Schnier, Dolores Lory, MD, 200 mg of elemental calcium at 05/17/22 2149   citalopram (CELEXA) tablet 10 mg, 10 mg, Oral, Daily, Schnier, Dolores Lory, MD, 10 mg at 05/22/22 0800   dextrose 5 % and 0.45 % NaCl with KCl 20 mEq/L infusion, , Intravenous, Continuous, Schnier, Dolores Lory, MD, Last Rate: 75 mL/hr at 05/22/22 0557, Infusion Verify at 05/22/22 0557   ferrous sulfate tablet 325 mg, 325 mg, Oral, Q breakfast, Schnier, Dolores Lory, MD, 325 mg at 05/22/22 0800   finasteride (PROSCAR) tablet 5 mg, 5 mg, Oral, Daily, Schnier, Dolores Lory, MD, 5 mg at 05/22/22 0801   fluticasone (FLONASE) 50 MCG/ACT nasal spray 2 spray, 2 spray, Each Nare, Daily PRN, Schnier, Dolores Lory, MD   hydrALAZINE (APRESOLINE) tablet 25 mg, 25 mg, Oral, Q8H PRN, Schnier, Dolores Lory, MD   latanoprost (XALATAN) 0.005 % ophthalmic solution 1 drop, 1 drop, Both Eyes, QHS, Schnier, Dolores Lory, MD, 1 drop at 05/19/22 2359   levothyroxine (SYNTHROID) tablet 137 mcg, 137 mcg, Oral, QAC breakfast, Schnier, Dolores Lory, MD, 137 mcg at 05/22/22 0546   methocarbamol (ROBAXIN) tablet 500 mg, 500 mg, Oral, BID, Schnier, Dolores Lory, MD, 500 mg at 05/21/22 6073   morphine (PF) 4 MG/ML injection 2 mg, 2 mg, Intravenous, Q1H PRN, Schnier, Dolores Lory, MD   ondansetron [facility]) injection 4 mg, 4 mg, Intravenous, Q6H PRN, Schnier, Dolores Lory, MD, 4 mg at 05/19/22 2358   oxyCODONE (Oxy IR/ROXICODONE) immediate release tablet 5-10 mg, 5-10 mg, Oral, Q4H PRN, Schnier, Dolores Lory, MD   pantoprazole (PROTONIX) EC tablet  40 mg, 40 mg, Oral, BID, Dorothe Pea, RPH, 40 mg at 05/22/22 0800   rosuvastatin (CRESTOR) tablet 20 mg, 20 mg, Oral, Daily, Schnier, Dolores Lory, MD, 20 mg at 05/22/22 0802   sodium chloride flush (NS) 0.9 % injection 3 mL, 3 mL, Intravenous, Q12H, Schnier, Dolores Lory, MD, 3 mL at 05/22/22 0803   sodium chloride flush (NS) 0.9 % injection 3 mL, 3 mL, Intravenous, PRN, Schnier, Dolores Lory, MD  sodium chloride     ampicillin-sulbactam (UNASYN)  IV 3 g (05/22/22 0252)   dextrose 5 % and 0.45 % NaCl with KCl 20 mEq/L 75 mL/hr at 05/22/22 0557    sodium chloride, acetaminophen, calcium carbonate, fluticasone, hydrALAZINE, morphine injection, ondansetron (ZOFRAN) IV, oxyCODONE, sodium chloride flush   Objective    Vitals:   05/21/22 1805 05/21/22 2024 05/22/22 0545 05/22/22 0758  BP: 138/69 137/61 134/62 118/61  Pulse: 68 69 65 63  Resp: '16 16 18 18  '$ Temp: 99.3 F (37.4 C) 98.6 F (37 C) 98.2 F (36.8 C) 97.9 F (36.6 C)  TempSrc:  Oral  Oral  SpO2: 99% 98% 98% 95%  Weight:      Height:         Physical Exam Vitals and nursing note reviewed.  Constitutional:      General: He is not in acute distress.    Appearance: Normal appearance. He is not ill-appearing, toxic-appearing or diaphoretic.  HENT:     Head: Normocephalic and atraumatic.     Nose: Nose normal.     Mouth/Throat:     Mouth: Mucous membranes are moist.     Pharynx: Oropharynx is clear.  Eyes:     General: No scleral icterus.    Extraocular Movements: Extraocular movements intact.  Cardiovascular:     Rate and Rhythm: Normal rate and regular rhythm.  Pulmonary:     Effort: Pulmonary effort is normal. No respiratory distress.     Breath sounds: Normal breath sounds. No wheezing, rhonchi or rales.  Abdominal:     General: Bowel sounds are normal. There is no distension.     Palpations: Abdomen is soft.     Tenderness: There is no abdominal tenderness. There is no guarding or rebound.  Musculoskeletal:     Cervical back: Neck supple.     Right lower leg: No edema.     Left lower leg: No edema.  Skin:    General: Skin is warm and dry.     Coloration: Skin is not jaundiced or pale.  Neurological:     General: No focal deficit present.     Mental Status: He is alert and oriented to person, place, and time. Mental status is at baseline.  Psychiatric:        Mood and Affect: Mood normal.        Behavior: Behavior normal.        Thought Content:  Thought content normal.        Judgment: Judgment normal.      Laboratory Data Recent Labs  Lab 05/17/22 0649 05/17/22 1639 05/21/22 0719 05/21/22 1248 05/22/22 0426  WBC 8.9   < > 16.6* 19.9* 13.8*  HGB 7.6*   < > 7.2* 7.8*  7.8* 6.9*  HCT 22.7*   < > 20.8* 22.5*  23.0* 20.5*  PLT 199   < > 184 215 210  NEUTOPHILPCT 77  --   --  89 83  LYMPHOPCT 14  --   --  5 8  MONOPCT 7  --   --  5 6  EOSPCT 1  --   --  0 2   < > = values in this interval not displayed.    Recent Labs  Lab 05/17/22 0649 05/21/22 0719 05/22/22 0426  NA 131* 133* 138  K 4.1 3.1* 3.4*  CL 101 102 107  CO2 '22 26 27  '$ BUN 25* <5* 6*  CREATININE 0.83 0.59* 0.61  CALCIUM 8.4* 7.8* 7.5*  PROT 5.4*  --  4.9*  BILITOT 0.7  --  0.6  ALKPHOS 29*  --  35*  ALT 16  --  22  AST 21  --  23  GLUCOSE 154* 141* 114*    Recent Labs  Lab 05/17/22 1639  INR 1.2       Imaging Studies: No results found.  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  # diarrhea- resolved  Plan:  Continues to improve Hemoglobin 6.9 today suspect equilibration.  He is to receive 1 unit PRBCs prior to discharge per hospital team No abdominal pain.  No signs of GI bleeding Will need repeat outpatient colonoscopy and potential VCE.  To follow-up with Dr. Vicente Males at [facility] and daughter-in-law voiced understanding White count has improved Recommend CBC check in 1 week Continue Iv iron infusion, b12 replacement  Nsaid avoidance Supportive care as per primary team  Advance diet as tolerated  GI to sign off. Available as needed.   I personally performed the service.  Management of other medical comorbidities as per primary team  Thank you for allowing [place] to participate in this patient's care. Please don't  hesitate to call if any questions or concerns arise.   Annamaria Helling, DO [facility]  Portions of the record may have been created with voice recognition software. Occasional wrong-word or 'sound-a-like' substitutions may have occurred due to the inherent limitations of voice recognition software.  Read the chart carefully and recognize, using context, where substitutions may have occurred.

## 2022-05-22 NOTE — Plan of Care (Signed)

## 2022-05-22 NOTE — Discharge Summary (Signed)
Physician Discharge Summary  Brett Ray TDD:[id number] DOB: 11/06/1931 DOA: 05/17/2022  PCP: Derinda Late, MD  Admit date: 05/17/2022 Discharge date: 05/22/2022  Admitted From: Home Disposition: Home  Recommendations for Outpatient Follow-up:  Follow up with PCP in 1-2 weeks Recheck CBC in 1 week.  Home Health: N/A Equipment/Devices: N/A  Discharge Condition: Stable CODE STATUS: Full code Diet recommendation: Low-salt diet  Discharge summary: 88 year old gentleman with history of hypertension, hyperlipidemia, hypothyroidism, mitral valve regurgitation, BPH presented to the emergency room with diarrhea and dark stool.  In the emergency room hemodynamically stable.  Hemoglobin 7.6 from recent hemoglobin of 12.  FOBT positive.  Blood pressure stable.   CTA of the abdomen pelvis negative for acute bleeding.   EGD and colonoscopy with no source of bleeding, bleeding from the small intestine. Repeat CT angiogram with bleeding from hepatic flexure area Selective angiogram and coil embolization SMA branch by vascular surgery with clinical improvement. Had developed some abdominal discomfort and leukocytosis after vascular surgery procedure, however improved.  He was also treated with antibiotics and IV fluids after the procedure.  Acute blood loss anemia: Received total 5 units of PRBC.  Hemoglobin 6.9 today, no more evidence of bleeding.  He has received his fifth unit of blood transfusion after hemoglobin 6.9, since he is clinically stable we will recheck in 1 week. Received 1 units of IV iron Discharged on oral iron therapy Increase PPI to twice daily. Avoid all NSAIDs.  Chronic medical issues are stable. Essential hypertension, stable Hypothyroidism, stable Depression on Celexa  Vitamin B12 levels borderline low.  Given 1 dose of vitamin B12 injectable 1 mg in the hospital.  Will discharge with 1000 mcg daily dose.  Stable for discharge.  Discharge Diagnoses:  Principal  Problem:   Diarrhea Active Problems:   GI bleeding   Acute blood loss anemia   HTN (hypertension)   Hypothyroid   BPH (benign prostatic hyperplasia)   Depression   Hyperlipidemia    Discharge Instructions  Discharge Instructions     Call MD for:  persistant dizziness or light-headedness   Complete by: As directed    Call MD for:  severe uncontrolled pain   Complete by: As directed    Diet - low sodium heart healthy   Complete by: As directed    Discharge instructions   Complete by: As directed    Recheck hemoglobin in 1 week. Gastroenterology will schedule follow-up.   Increase activity slowly   Complete by: As directed       Allergies as of 05/22/2022   No Known Allergies      Medication List     STOP taking these medications    aspirin EC 81 MG tablet   bimatoprost 0.03 % ophthalmic solution Commonly known as: LUMIGAN   HYDROcodone-acetaminophen 5-325 MG tablet Commonly known as: NORCO/VICODIN   Lumigan 0.01 % Soln Generic drug: bimatoprost   meloxicam 15 MG tablet Commonly known as: MOBIC       TAKE these medications    calcium carbonate 500 MG chewable tablet Commonly known as: TUMS - dosed in mg elemental calcium Chew 1 tablet by mouth 2 (two) times daily as needed for indigestion or heartburn.   citalopram 10 MG tablet Commonly known as: CELEXA Take 1 tablet by mouth daily.   ferrous sulfate 325 (65 FE) MG tablet Take 1 tablet (325 mg total) by mouth daily with breakfast. Start taking on: May 23, 2022   finasteride 5 MG tablet Commonly known as: PROSCAR Take  5 mg by mouth daily.   fluticasone 27.5 MCG/SPRAY nasal spray Commonly known as: VERAMYST Place 2 sprays into the nose daily.   hydrALAZINE 25 MG tablet Commonly known as: APRESOLINE TAKE 1 TABLET BY MOUTH THREE TIMES DAILY AS NEEDED( PRESSURE> 170)   latanoprost 0.005 % ophthalmic solution Commonly known as: XALATAN 1 drop at bedtime.   levothyroxine 137 MCG  tablet Commonly known as: SYNTHROID Take 137 mcg by mouth daily before breakfast. What changed: Another medication with the same name was removed. Continue taking this medication, and follow the directions you see here.   methocarbamol 500 MG tablet Commonly known as: ROBAXIN Take 500 mg by mouth 2 (two) times daily.   omeprazole 20 MG capsule Commonly known as: PRILOSEC Take 1 capsule (20 mg total) by mouth 2 (two) times daily before a meal. What changed: when to take this   polyethylene glycol 17 g packet Commonly known as: MIRALAX / GLYCOLAX Take 17 g by mouth daily.   potassium chloride SA 20 MEQ tablet Commonly known as: KLOR-CON M Take 1 tablet (20 mEq total) by mouth daily for 7 days.   PROBIOTIC DAILY PO Take by mouth daily.   rosuvastatin 20 MG tablet Commonly known as: CRESTOR TAKE 1 TABLET(20 MG) BY MOUTH AT BEDTIME   vitamin B-12 1000 MCG tablet Commonly known as: CYANOCOBALAMIN Take 1 tablet (1,000 mcg total) by mouth daily.   vitamin C 500 MG tablet Commonly known as: ASCORBIC ACID Take 500 mg by mouth daily.               Durable Medical Equipment  (From admission, onward)           Start     Ordered   05/21/22 1106  For home use only DME Walker rolling  Once       Question Answer Comment  Walker: With 5 Inch Wheels   Patient needs a walker to treat with the following condition Difficulty walking      05/21/22 1105            No Known Allergies  Consultations: Gastroenterology Vascular surgery Intervention radiology    Procedures/Studies: DG C-Arm 1-60 Min-No Report  Result Date: 05/19/2022 Fluoroscopy was utilized by the requesting physician.  No radiographic interpretation.   PERIPHERAL VASCULAR CATHETERIZATION  Result Date: 05/19/2022 See surgical note for result.  CT Angio Abd/Pel w/ and/or w/o  Addendum Date: 05/19/2022   ADDENDUM REPORT: 05/19/2022 14:13 ADDENDUM: Case was discussed with GI team, with no colonic  hemorrhage identified on colonoscopy, and positive findings of hematochezia of terminal ileum. FINDINGS: After review, the puddling/pooling of contrast is within loops of small bowel at the distal ileum/terminal ileum, however the terminal ileum loops are located right upper quadrant between liver and the abdominal wall. IMPRESSION: After secondary review, CT angiogram is positive for evidence of acute GI hemorrhage, however not within hepatic flexure, but instead within distal ileal loops/terminal ileum. Distal small bowel loops are transposed into the right upper quadrant, superficial to the liver. Electronically Signed   By: Corrie Mckusick D.O.   On: 05/19/2022 14:13   Result Date: 05/19/2022 CLINICAL DATA:  88 year old male with GI bleeding EXAM: CTA ABDOMEN AND PELVIS WITHOUT AND WITH CONTRAST TECHNIQUE: Multidetector CT imaging of the abdomen and pelvis was performed using the standard protocol during bolus administration of intravenous contrast. Multiplanar reconstructed images and MIPs were obtained and reviewed to evaluate the vascular anatomy. RADIATION DOSE REDUCTION: This exam was performed according to  the departmental dose-optimization program which includes automated exposure control, adjustment of the mA and/or kV according to patient size and/or use of iterative reconstruction technique. CONTRAST:  176m OMNIPAQUE IOHEXOL 350 MG/ML SOLN COMPARISON:  07/16/2021, 05/17/2022 FINDINGS: VASCULAR Aorta: Atherosclerotic changes of the abdominal aorta. Tortuosity. No no wall thickening or inflammatory changes. Irregular plaque/chronic ulcerated plaque along the right aspect of the aorta, just inferior to the right renal artery takeoff. This results in maximum diameter of the aorta on the coronal images 25 mm. Celiac: Atherosclerotic changes without high-grade stenosis SMA: Atherosclerotic changes without high-grade stenosis Renals: - Right: Right renal artery patent. - Left: Atherosclerotic changes at the  origin of the left renal artery, estimated 50% narrowing. IMA: Inferior mesenteric artery is patent. Right lower extremity: Tortuosity of the right iliac system. No aneurysm, dissection, or occlusion. Mild atherosclerosis. Hypogastric artery is patent. Common femoral artery patent. Proximal SFA and profunda femoris patent. Left lower extremity: Tortuosity of the left iliac system. No aneurysm, dissection, or occlusion. Hypogastric artery is patent. Mild atherosclerosis. Common femoral artery patent. Proximal SFA and profunda femoris patent. Veins: Unremarkable appearance of the venous system. Review of the MIP images confirms the above findings. NON-VASCULAR Lower chest: No acute. Hepatobiliary: Unremarkable appearance of the liver. High density material within the gallbladder, potentially sludge/microlithiasis. Pancreas: Rounded low-density lesion within the pancreatic head associated with the ducts near the ampulla. No inflammatory changes. Spleen: Punctate calcifications, suggesting prior granulomatous disease. Adrenals/Urinary Tract: - Right adrenal gland: Unremarkable - Left adrenal gland: Unremarkable. - Right kidney: No hydronephrosis, nephrolithiasis, inflammation, or ureteral dilation. No focal lesion. - Left Kidney: No hydronephrosis, nephrolithiasis, inflammation, or ureteral dilation. No focal lesion. - Urinary Bladder: Urinary bladder partially distended. Diverticulum at the bladder base. Stomach/Bowel: - Stomach: Small hiatal hernia.  Otherwise unremarkable stomach. - Small bowel: Small bowel demonstrates no transition point, with mild gaseous distension compared to the prior. No extravasation of contrast or puddling of contrast. No wall thickening. Wall enhancement appears within normal limits. - Appendix: Appendix is not visualized, however, no inflammatory changes are present adjacent to the cecum to indicate an appendicitis. - Colon: Fluid-filled colon. Puddling of contrast present at the hepatic  flexure. Questionable wall thickening of the ascending colon/hepatic flexure at this site persists. Left-sided diverticula again noted. Lymphatic: No adenopathy. Mesenteric: No free fluid or air. No mesenteric adenopathy. Reproductive: Transverse diameter of the prostate 34 mm. Right-sided hydrocele. Other: Bilateral fat containing inguinal hernia. Musculoskeletal: Degenerative changes of the spine. No displaced fracture. No significant bony canal narrowing. No aggressive lytic or sclerotic lesions. IMPRESSION: CT angiogram positive for evidence of acute GI hemorrhage, with accumulation of extravasated contrast at the hepatic flexure. Associated wall thickening, suggesting acute inflammation/colitis. 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 Aortic Atherosclerosis (ICD10-I70.0). Additional ancillary findings as above. Signed, JDulcy Fanny WNadene Rubins RPVI [facility] Center[facility] Electronically Signed: By: JCorrie MckusickD.O. On: 05/19/2022 12:49   CT Angio Abd/Pel W and/or Wo Contrast  Result Date: 05/17/2022 CLINICAL DATA:  Diarrhea, GI bleed. EXAM: CTA ABDOMEN AND PELVIS WITHOUT AND WITH CONTRAST TECHNIQUE: Multidetector CT imaging of the abdomen and pelvis was performed using the standard protocol during bolus administration of intravenous contrast. Multiplanar reconstructed images and MIPs were obtained and reviewed to evaluate the vascular anatomy. RADIATION DOSE REDUCTION: This exam was performed according  to the departmental dose-optimization program which includes automated exposure control, adjustment of the mA and/or kV according to patient size and/or use of iterative reconstruction technique. CONTRAST:  59m OMNIPAQUE IOHEXOL 350 MG/ML SOLN COMPARISON:  CT  abdomen dated 07/16/2021 FINDINGS: VASCULAR Aorta: Extensive aortic atherosclerosis, including ulcerated plaque of the ectatic infrarenal abdominal aorta. No aortic aneurysm or evidence of acute dissection. No significant stenosis. Celiac: Patent without evidence of aneurysm, dissection, vasculitis or significant stenosis. SMA: Patent without evidence of aneurysm, dissection, vasculitis or significant stenosis. Atherosclerosis at the origin and at the proximal branching of the SMA, but normal contrast flow is seen into the more distal branches without evidence of a hemodynamically significant stenosis. Renals: Atherosclerosis at the bilateral ostia, but normal flow into the renal arteries and distal branches without evidence of a hemodynamically significant stenosis. IMA: Patent without evidence of aneurysm, dissection, vasculitis or significant stenosis. Inflow: Patent without evidence of aneurysm, dissection, vasculitis or significant stenosis. Proximal Outflow: Bilateral common femoral and visualized portions of the superficial and profunda femoral arteries are patent without evidence of aneurysm, dissection, vasculitis or significant stenosis. Veins: No obvious venous abnormality within the limitations of this arterial phase study. Review of the MIP images confirms the above findings. NON-VASCULAR Lower chest: No acute abnormality. Hepatobiliary: No focal liver abnormality is seen. No gallstones, gallbladder wall thickening, or biliary dilatation. Pancreas: Unremarkable. No pancreatic ductal dilatation or surrounding inflammatory changes. Spleen: Normal in size without focal abnormality. Adrenals/Urinary Tract: Adrenal glands are unremarkable. Kidneys appear normal without mass, stone or hydronephrosis. No perinephric fluid. No ureteral or bladder calculi are identified. Bladder is unremarkable. Stomach/Bowel: No dilated large or small bowel loops. Mild thickening of the walls of the ascending colon, with subtle  pericolonic inflammation/fluid stranding. Appendix is not seen but there are no focal inflammatory changes about the cecum to suggest acute appendicitis. No active hemorrhage is identified within the large or small bowel. Scattered diverticulosis within the sigmoid colon but no focal inflammatory change to suggest acute diverticulitis. Stomach is unremarkable, partially decompressed. Lymphatic: No enlarged lymph nodes are seen. Reproductive: Prostate is unremarkable. Other: Trace free fluid in the lower pelvis. No additional free fluid is seen. No abscess collection. No free intraperitoneal air. Musculoskeletal: No acute-appearing osseous abnormality. Degenerative spondylosis of the slightly scoliotic thoracolumbar spine, mild to moderate in degree. IMPRESSION: 1. No active hemorrhage is identified within the large or small bowel. 2. Mild thickening of the walls of the ascending colon, with subtle pericolonic inflammation/fluid stranding, suspicious for a mild colitis of infectious or inflammatory nature. 3. Colonic diverticulosis without evidence of acute diverticulitis. Aortic Atherosclerosis (ICD10-I70.0). Electronically Signed   By: SFranki CabotM.D.   On: 05/17/2022 09:26   (Echo, Carotid, EGD, Colonoscopy, ERCP)    Subjective: Patient seen and examined in the morning rounds.  He had no complaints.  Hemoglobin was 6.9.  Receiving another unit of blood. Patient walking around in the hallway, eager to get home. He had 1 bowel movement today and was clear without any evidence of bleeding.   Discharge Exam: Vitals:   05/22/22 0758 05/22/22 1426  BP: 118/61 (!) 129/58  Pulse: 63 60  Resp: 18 16  Temp: 97.9 F (36.6 C) 98.4 F (36.9 C)  SpO2: 95% 99%   Vitals:   05/21/22 2024 05/22/22 0545 05/22/22 0758 05/22/22 1426  BP: 137/61 134/62 118/61 (!) 129/58  Pulse: 69 65 63 60  Resp: '16 18 18 16  '$ Temp: 98.6 F (37 C) 98.2 F (36.8 C)  97.9 F (36.6 C) 98.4 F (36.9 C)  TempSrc: Oral  Oral  Oral  SpO2: 98% 98% 95% 99%  Weight:      Height:        General: Pt is alert, awake, not in acute distress Younger than his stated age. Cardiovascular: RRR, S1/S2 +, no rubs, no gallops Respiratory: CTA bilaterally, no wheezing, no rhonchi Abdominal: Soft, NT, ND, bowel sounds + Extremities: no edema, no cyanosis    The results of significant diagnostics from this hospitalization (including imaging, microbiology, ancillary and laboratory) are listed below for reference.     Microbiology: No results found for this or any previous visit (from the past 240 hour(s)).   Labs: BNP (last 3 results) No results for input(s): "BNP" in the last 8760 hours. Basic Metabolic Panel: Recent Labs  Lab 05/17/22 0649 05/21/22 0719 05/22/22 0426  NA 131* 133* 138  K 4.1 3.1* 3.4*  CL 101 102 107  CO2 '22 26 27  '$ GLUCOSE 154* 141* 114*  BUN 25* <5* 6*  CREATININE 0.83 0.59* 0.61  CALCIUM 8.4* 7.8* 7.5*  MG  --   --  2.1  PHOS  --   --  3.1   Liver Function Tests: Recent Labs  Lab 05/17/22 0649 05/22/22 0426  AST 21 23  ALT 16 22  ALKPHOS 29* 35*  BILITOT 0.7 0.6  PROT 5.4* 4.9*  ALBUMIN 3.6 2.8*   Recent Labs  Lab 05/17/22 0649  LIPASE 45   No results for input(s): "AMMONIA" in the last 168 hours. CBC: Recent Labs  Lab 05/17/22 0649 05/17/22 1639 05/18/22 1035 05/18/22 1701 05/20/22 1049 05/20/22 1931 05/21/22 0719 05/21/22 1248 05/22/22 0426  WBC 8.9   < > 6.3  --  18.7*  --  16.6* 19.9* 13.8*  NEUTROABS 6.9  --   --   --   --   --   --  17.7* 11.5*  HGB 7.6*   < > 7.8*   < > 7.9* 8.1* 7.2* 7.8*  7.8* 6.9*  HCT 22.7*   < > 22.4*   < > 22.4* 23.0* 20.8* 22.5*  23.0* 20.5*  MCV 93.0   < > 88.5  --  86.8  --  87.4 90.0 89.9  PLT 199   < > 145*  --  176  --  184 215 210   < > = values in this interval not displayed.   Cardiac Enzymes: No results for input(s): "CKTOTAL", "CKMB", "CKMBINDEX", "TROPONINI" in the last 168 hours. BNP: Invalid input(s):  "POCBNP" CBG: Recent Labs  Lab 05/19/22 0800 05/19/22 2316 05/20/22 0834 05/20/22 1227 05/22/22 0750  GLUCAP 103* 100* 142* 179* 110*   D-Dimer No results for input(s): "DDIMER" in the last 72 hours. Hgb A1c No results for input(s): "HGBA1C" in the last 72 hours. Lipid Profile No results for input(s): "CHOL", "HDL", "LDLCALC", "TRIG", "CHOLHDL", "LDLDIRECT" in the last 72 hours. Thyroid function studies No results for input(s): "TSH", "T4TOTAL", "T3FREE", "THYROIDAB" in the last 72 hours.  Invalid input(s): "FREET3" Anemia work up No results for input(s): "VITAMINB12", "FOLATE", "FERRITIN", "TIBC", "IRON", "RETICCTPCT" in the last 72 hours. Urinalysis    Component Value Date/Time   COLORURINE YELLOW (A) 07/16/2021 0521   APPEARANCEUR CLEAR (A) 07/16/2021 0521   LABSPEC 1.012 07/16/2021 0521   PHURINE 7.0 07/16/2021 0521   GLUCOSEU NEGATIVE 07/16/2021 0521   HGBUR NEGATIVE 07/16/2021 0521   BILIRUBINUR NEGATIVE 07/16/2021 0521   KETONESUR NEGATIVE 07/16/2021 0521   PROTEINUR NEGATIVE  07/16/2021 0521   NITRITE NEGATIVE 07/16/2021 0521   LEUKOCYTESUR NEGATIVE 07/16/2021 0521   Sepsis Labs Recent Labs  Lab 05/20/22 1049 05/21/22 0719 05/21/22 1248 05/22/22 0426  WBC 18.7* 16.6* 19.9* 13.8*   Microbiology No results found for this or any previous visit (from the past 240 hour(s)).   Time coordinating discharge: 40 minutes  SIGNED:   Barb Merino, MD  [facility] 05/22/2022, 2:34 PM

## 2022-05-22 NOTE — Progress Notes (Signed)
Looks good this a.m., though he says he is easily fatigued.  No blood per rectum.  Hemoglobin 6.9.  At this point, would IV, transfuse 1 unit if okay with primary service, likely can go home later today.  I discussed all this with him at length.  I will discuss this with primary service as well.

## 2022-05-22 NOTE — Anesthesia Postprocedure Evaluation (Signed)
Anesthesia Post Note  Patient: Brett Ray  Procedure(s) Performed: ESOPHAGOGASTRODUODENOSCOPY (EGD) WITH PROPOFOL COLONOSCOPY WITH PROPOFOL  Patient location during evaluation: Endoscopy Anesthesia Type: General Level of consciousness: awake and alert Pain management: pain level controlled Vital Signs Assessment: post-procedure vital signs reviewed and stable Respiratory status: spontaneous breathing, nonlabored ventilation, respiratory function stable and patient connected to nasal cannula oxygen Cardiovascular status: blood pressure returned to baseline and stable Postop Assessment: no apparent nausea or vomiting Anesthetic complications: no   No notable events documented.   Last Vitals:  Vitals:   05/22/22 1441 05/22/22 1700  BP: (!) 120/49 (!) 134/57  Pulse: 67 66  Resp: 16 16  Temp: 36.9 C 36.9 C  SpO2: 97% 100%    Last Pain:  Vitals:   05/22/22 1700  TempSrc: Oral  PainSc:                  Martha Clan

## 2022-05-23 ENCOUNTER — Other Ambulatory Visit: Payer: Self-pay | Admitting: Internal Medicine

## 2022-05-23 ENCOUNTER — Other Ambulatory Visit: Payer: Self-pay

## 2022-05-23 ENCOUNTER — Telehealth: Payer: Self-pay

## 2022-05-23 DIAGNOSIS — D509 Iron deficiency anemia, unspecified: Secondary | ICD-10-CM

## 2022-05-23 DIAGNOSIS — D649 Anemia, unspecified: Secondary | ICD-10-CM

## 2022-05-23 DIAGNOSIS — E538 Deficiency of other specified B group vitamins: Secondary | ICD-10-CM | POA: Insufficient documentation

## 2022-05-23 DIAGNOSIS — K922 Gastrointestinal hemorrhage, unspecified: Secondary | ICD-10-CM

## 2022-05-23 NOTE — Telephone Encounter (Signed)
Called patient but was not able to communicate with him nor his wife. Therefore, I left a voicemail to let him know that I scheduled his an appointment with DR. Anna on 06/14/2022 at 2:45 PM with Dr. Vicente Males. I let him know that if he had any questions, he could call us back.

## 2022-05-25 ENCOUNTER — Telehealth: Payer: Self-pay | Admitting: Cardiovascular Disease

## 2022-05-25 NOTE — Telephone Encounter (Signed)
Pt c/o medication issue:  1. Name of Medication: hydrALAZINE (APRESOLINE) 25 MG tablet  2. How are you currently taking this medication (dosage and times per day)?   3. Are you having a reaction (difficulty breathing--STAT)?   4. What is your medication issue?  TAKE 1 TABLET BY MOUTH THREE TIMES DAILY AS NEEDED( PRESSURE> 170)  Pt is requesting a call back to get clarification on this medication and how to take it. Please advise.

## 2022-05-25 NOTE — Telephone Encounter (Signed)
Called patient. No answer. Detailed message left per DPR. Advised patient to call back if further questions.

## 2022-05-25 NOTE — Telephone Encounter (Signed)
Patient called back. Spoke with patient and went over how to take medication. Pt verbalized understanding and voiced appreciation for the help.

## 2022-05-27 ENCOUNTER — Inpatient Hospital Stay: Payer: Medicare Other | Admitting: Internal Medicine

## 2022-05-27 ENCOUNTER — Inpatient Hospital Stay: Payer: Medicare Other | Attending: Internal Medicine

## 2022-05-27 MED FILL — Iron Sucrose Inj 20 MG/ML (Fe Equiv): INTRAVENOUS | Qty: 10 | Status: AC

## 2022-06-08 ENCOUNTER — Ambulatory Visit: Payer: Medicare Other | Admitting: Dermatology

## 2022-06-08 DIAGNOSIS — L570 Actinic keratosis: Secondary | ICD-10-CM

## 2022-06-08 DIAGNOSIS — D229 Melanocytic nevi, unspecified: Secondary | ICD-10-CM

## 2022-06-08 DIAGNOSIS — L821 Other seborrheic keratosis: Secondary | ICD-10-CM | POA: Diagnosis not present

## 2022-06-08 DIAGNOSIS — L814 Other melanin hyperpigmentation: Secondary | ICD-10-CM | POA: Diagnosis not present

## 2022-06-08 DIAGNOSIS — Z1283 Encounter for screening for malignant neoplasm of skin: Secondary | ICD-10-CM | POA: Diagnosis not present

## 2022-06-08 DIAGNOSIS — L578 Other skin changes due to chronic exposure to nonionizing radiation: Secondary | ICD-10-CM

## 2022-06-08 DIAGNOSIS — D1800 Hemangioma unspecified site: Secondary | ICD-10-CM

## 2022-06-08 DIAGNOSIS — L820 Inflamed seborrheic keratosis: Secondary | ICD-10-CM | POA: Diagnosis not present

## 2022-06-08 NOTE — Progress Notes (Signed)
Follow-Up Visit   Subjective  Brett Ray is a 88 y.o. male who presents for the following: Annual Exam (History of Aks - The patient presents for Total-Body Skin Exam (TBSE) for skin cancer screening and mole check.  The patient has spots, moles and lesions to be evaluated, some may be new or changing and the patient has concerns that these could be cancer./) and Other (Scabs of scalp that catch on his comb).  The following portions of the chart were reviewed this encounter and updated as appropriate:   Tobacco  Allergies  Meds  Problems  Med Hx  Surg Hx  Fam Hx     Review of Systems:  No other skin or systemic complaints except as noted in HPI or Assessment and Plan.  Objective  Well appearing patient in no apparent distress; mood and affect are within normal limits.  A full examination was performed including scalp, head, eyes, ears, nose, lips, neck, chest, axillae, abdomen, back, buttocks, bilateral upper extremities, bilateral lower extremities, hands, feet, fingers, toes, fingernails, and toenails. All findings within normal limits unless otherwise noted below.  Scalp (7) Erythematous thin papules/macules with gritty scale.   Face x 15, back x 1 (16) Erythematous stuck-on, waxy papule or plaque   Assessment & Plan   Lentigines - Scattered tan macules - Due to sun exposure - Benign-appearing, observe - Recommend daily broad spectrum sunscreen SPF 30+ to sun-exposed areas, reapply every 2 hours as needed. - Call for any changes  Seborrheic Keratoses - Stuck-on, waxy, tan-brown papules and/or plaques  - Benign-appearing - Discussed benign etiology and prognosis. - Observe - Call for any changes  Melanocytic Nevi - Tan-brown and/or pink-flesh-colored symmetric macules and papules - Benign appearing on exam today - Observation - Call clinic for new or changing moles - Recommend daily use of broad spectrum spf 30+ sunscreen to sun-exposed areas.    Hemangiomas - Red papules - Discussed benign nature - Observe - Call for any changes  Actinic Damage - Chronic condition, secondary to cumulative UV/sun exposure - diffuse scaly erythematous macules with underlying dyspigmentation - Recommend daily broad spectrum sunscreen SPF 30+ to sun-exposed areas, reapply every 2 hours as needed.  - Staying in the shade or wearing long sleeves, sun glasses (UVA+UVB protection) and wide brim hats (4-inch brim around the entire circumference of the hat) are also recommended for sun protection.  - Call for new or changing lesions.  Skin cancer screening performed today.  AK (actinic keratosis) (7) Scalp Destruction of lesion - Scalp Complexity: simple   Destruction method: cryotherapy   Informed consent: discussed and consent obtained   Timeout:  patient name, date of birth, surgical site, and procedure verified Lesion destroyed using liquid nitrogen: Yes   Region frozen until ice ball extended beyond lesion: Yes   Outcome: patient tolerated procedure well with no complications   Post-procedure details: wound care instructions given    Inflamed seborrheic keratosis (16) Face x 15, back x 1 Destruction of lesion - Face x 15, back x 1 Complexity: simple   Destruction method: cryotherapy   Informed consent: discussed and consent obtained   Timeout:  patient name, date of birth, surgical site, and procedure verified Lesion destroyed using liquid nitrogen: Yes   Region frozen until ice ball extended beyond lesion: Yes   Outcome: patient tolerated procedure well with no complications   Post-procedure details: wound care instructions given    Return in about 1 year (around 06/09/2023) for TBSE.  I,  Ashok Cordia, CMA, am acting as scribe for Sarina Ser, MD . Documentation: I have reviewed the above documentation for accuracy and completeness, and I agree with the above.  Sarina Ser, MD

## 2022-06-08 NOTE — Patient Instructions (Addendum)
Cryotherapy Aftercare  Wash gently with soap and water everyday.   Apply Vaseline and Band-Aid daily until healed.     Due to recent changes in healthcare laws, you may see results of your pathology and/or laboratory studies on MyChart before the doctors have had a chance to review them. We understand that in some cases there may be results that are confusing or concerning to you. Please understand that not all results are received at the same time and often the doctors may need to interpret multiple results in order to provide you with the best plan of care or course of treatment. Therefore, we ask that you please give us 2 business days to thoroughly review all your results before contacting the office for clarification. Should we see a critical lab result, you will be contacted sooner.   If You Need Anything After Your Visit  If you have any questions or concerns for your doctor, please call our main line at [phone] and press option 4 to reach your doctor's medical assistant. If no one answers, please leave a voicemail as directed and we will return your call as soon as possible. Messages left after 4 pm will be answered the following business day.   You may also send us a message via MyChart. We typically respond to MyChart messages within 1-2 business days.  For prescription refills, please ask your pharmacy to contact our office. Our fax number is [phone].  If you have an urgent issue when the clinic is closed that cannot wait until the next business day, you can page your doctor at the number below.    Please note that while we do our best to be available for urgent issues outside of office hours, we are not available 24/7.   If you have an urgent issue and are unable to reach us, you may choose to seek medical care at your doctor's office, retail clinic, urgent care center, or emergency room.  If you have a medical emergency, please immediately call 911 or go to the  emergency department.  Pager Numbers  - Dr. Kowalski: [phone]  - Dr. Moye: [phone]  - Dr. Stewart: [phone]  In the event of inclement weather, please call our main line at [phone] for an update on the status of any delays or closures.  Dermatology Medication Tips: Please keep the boxes that topical medications come in in order to help keep track of the instructions about where and how to use these. Pharmacies typically print the medication instructions only on the boxes and not directly on the medication tubes.   If your medication is too expensive, please contact our office at [phone] option 4 or send us a message through MyChart.   We are unable to tell what your co-pay for medications will be in advance as this is different depending on your insurance coverage. However, we may be able to find a substitute medication at lower cost or fill out paperwork to get insurance to cover a needed medication.   If a prior authorization is required to get your medication covered by your insurance company, please allow us 1-2 business days to complete this process.  Drug prices often vary depending on where the prescription is filled and some pharmacies may offer cheaper prices.  The website www.goodrx.com contains coupons for medications through different pharmacies. The prices here do not account for what the cost may be with help from insurance (it may be cheaper with your insurance), but the website can   give you the price if you did not use any insurance.  - You can print the associated coupon and take it with your prescription to the pharmacy.  - You may also stop by our office during regular business hours and pick up a GoodRx coupon card.  - If you need your prescription sent electronically to a different pharmacy, notify our office through [facility] MyChart or by phone at [phone] option 4.     Si Usted Necesita Algo Despus de Su Visita  Tambin puede  enviarnos un mensaje a travs de MyChart. Por lo general respondemos a los mensajes de MyChart en el transcurso de 1 a 2 das hbiles.  Para renovar recetas, por favor pida a su farmacia que se ponga en contacto con nuestra oficina. Nuestro nmero de fax es el [phone].  Si tiene un asunto urgente cuando la clnica est cerrada y que no puede esperar hasta el siguiente da hbil, puede llamar/localizar a su doctor(a) al nmero que aparece a continuacin.   Por favor, tenga en cuenta que aunque hacemos todo lo posible para estar disponibles para asuntos urgentes fuera del horario de oficina, no estamos disponibles las 24 horas del da, los 7 das de la semana.   Si tiene un problema urgente y no puede comunicarse con nosotros, puede optar por buscar atencin mdica  en el consultorio de su doctor(a), en una clnica privada, en un centro de atencin urgente o en una sala de emergencias.  Si tiene una emergencia mdica, por favor llame inmediatamente al 911 o vaya a la sala de emergencias.  Nmeros de bper  - Dr. Kowalski: [phone]  - Dra. Moye: [phone]  - Dra. Stewart: [phone]  En caso de inclemencias del tiempo, por favor llame a nuestra lnea principal al [phone] para una actualizacin sobre el estado de cualquier retraso o cierre.  Consejos para la medicacin en dermatologa: Por favor, guarde las cajas en las que vienen los medicamentos de uso tpico para ayudarle a seguir las instrucciones sobre dnde y cmo usarlos. Las farmacias generalmente imprimen las instrucciones del medicamento slo en las cajas y no directamente en los tubos del medicamento.   Si su medicamento es muy caro, por favor, pngase en contacto con nuestra oficina llamando al [phone] y presione la opcin 4 o envenos un mensaje a travs de MyChart.   No podemos decirle cul ser su copago por los medicamentos por adelantado ya que esto es diferente dependiendo de la cobertura de su seguro.  Sin embargo, es posible que podamos encontrar un medicamento sustituto a menor costo o llenar un formulario para que el seguro cubra el medicamento que se considera necesario.   Si se requiere una autorizacin previa para que su compaa de seguros cubra su medicamento, por favor permtanos de 1 a 2 das hbiles para completar este proceso.  Los precios de los medicamentos varan con frecuencia dependiendo del lugar de dnde se surte la receta y alguna farmacias pueden ofrecer precios ms baratos.  El sitio web www.goodrx.com tiene cupones para medicamentos de diferentes farmacias. Los precios aqu no tienen en cuenta lo que podra costar con la ayuda del seguro (puede ser ms barato con su seguro), pero el sitio web puede darle el precio si no utiliz ningn seguro.  - Puede imprimir el cupn correspondiente y llevarlo con su receta a la farmacia.  - Tambin puede pasar por nuestra oficina durante el horario de atencin regular y recoger una tarjeta de cupones de GoodRx.  -   Si necesita que su receta se enve electrnicamente a una farmacia diferente, informe a nuestra oficina a travs de MyChart de [facility] o por telfono llamando al [phone] y presione la opcin 4.  

## 2022-06-12 ENCOUNTER — Encounter: Payer: Self-pay | Admitting: Dermatology

## 2022-06-13 LAB — BPAM RBC
Blood Product Expiration Date: 202307302359
Blood Product Expiration Date: 202308012359
ISSUE DATE / TIME: 202307091144
ISSUE DATE / TIME: 202307091417
Unit Type and Rh: 6200
Unit Type and Rh: 6200

## 2022-06-13 LAB — TYPE AND SCREEN
ABO/RH(D): AB POS
Antibody Screen: NEGATIVE
Unit division: 0
Unit division: 0

## 2022-06-14 ENCOUNTER — Encounter: Payer: Self-pay | Admitting: Gastroenterology

## 2022-06-14 ENCOUNTER — Ambulatory Visit: Payer: Medicare Other | Admitting: Gastroenterology

## 2022-06-14 ENCOUNTER — Other Ambulatory Visit: Payer: Self-pay

## 2022-06-14 VITALS — BP 129/70 | HR 59 | Temp 98.7°F | Ht 69.0 in | Wt 159.6 lb

## 2022-06-14 DIAGNOSIS — N529 Male erectile dysfunction, unspecified: Secondary | ICD-10-CM | POA: Insufficient documentation

## 2022-06-14 DIAGNOSIS — K222 Esophageal obstruction: Secondary | ICD-10-CM

## 2022-06-14 DIAGNOSIS — D509 Iron deficiency anemia, unspecified: Secondary | ICD-10-CM | POA: Diagnosis not present

## 2022-06-14 DIAGNOSIS — E785 Hyperlipidemia, unspecified: Secondary | ICD-10-CM | POA: Insufficient documentation

## 2022-06-14 DIAGNOSIS — I340 Nonrheumatic mitral (valve) insufficiency: Secondary | ICD-10-CM | POA: Insufficient documentation

## 2022-06-14 MED ORDER — NA SULFATE-K SULFATE-MG SULF 17.5-3.13-1.6 GM/177ML PO SOLN: 354.0000 mL | Freq: Once | ORAL | 0 refills | Status: AC

## 2022-06-14 NOTE — Addendum Note (Signed)
Addended by: Wayna Chalet on: 06/14/2022 03:34 PM   Modules accepted: Orders

## 2022-06-14 NOTE — Progress Notes (Signed)
Brett Bellows MD, MRCP(U.K) [address]  [place]  [place], [place] [address]  Main: [phone]  Fax: [phone]   Primary Care Physician: Derinda Late, MD  Primary Gastroenterologist:  Dr. Jonathon Ray   Chief Complaint  Patient presents with   IDA    HPI: Brett Ray is a 88 y.o. male    Summary of history :  He was admitted to the hospital in early July 2023 with iron deficiency anemia.  EGD showed no gross abnormalities except a Schatzki's ring, medium sized hiatal hernia.  Fresh blood was noted coming out of the terminal ileum on colonoscopy CT angiogram showed showed brisk bleed in the terminal ileum and then underwent embolization by vascular surgery subsequently his hemoglobin was stable was also found to have B12 deficiency plan was to perform a capsule study as an outpatient and consider repeat colonoscopy due to limited visualization of the colon as an outpatient to rule out AVMs  Interval history 05/20/2022-06/14/2022  06/13/2022 hemoglobin 11.6 g, MCV 96 Denies any overt blood loss.  Taking oral iron pills.  Early morning occasionally has hard stools.  Denies any dysphagia whatsoever.  No other complaints.       Latest Ref Rng & Units 05/22/2022    4:26 AM 05/21/2022   12:48 PM 05/21/2022    7:19 AM  CBC  WBC 4.0 - 10.5 K/uL 13.8  19.9  16.6   Hemoglobin 13.0 - 17.0 g/dL 6.9  7.8    7.8  7.2   Hematocrit 39.0 - 52.0 % 20.5  23.0    22.5  20.8   Platelets 150 - 400 K/uL 210  215  184    Iron/TIBC/Ferritin/ %Sat    Component Value Date/Time   IRON 56 05/17/2022 0649   TIBC 349 05/17/2022 0649   FERRITIN 21 (L) 05/17/2022 0649   IRONPCTSAT 16 (L) 05/17/2022 0649    Current Outpatient Medications  Medication Sig Dispense Refill   amoxicillin (AMOXIL) 500 MG capsule Take 500 mg by mouth 4 (four) times daily.     brimonidine (ALPHAGAN) 0.2 % ophthalmic solution Place 1 drop into both eyes 2 (two) times daily.     calcium carbonate (TUMS - DOSED IN  MG ELEMENTAL CALCIUM) 500 MG chewable tablet Chew 1 tablet by mouth 2 (two) times daily as needed for indigestion or heartburn.     chlorpheniramine-HYDROcodone 10-8 MG/5ML Take 5 mLs by mouth every 12 (twelve) hours as needed.     citalopram (CELEXA) 10 MG tablet Take 1 tablet by mouth daily.  3   erythromycin ophthalmic ointment APPLY A SMALL AMOUNT IN AFFECTED EYE(S) AT BEDTIME     ferrous sulfate 325 (65 FE) MG tablet Take 1 tablet (325 mg total) by mouth daily with breakfast. 90 tablet 0   finasteride (PROSCAR) 5 MG tablet Take 5 mg by mouth daily.     fluticasone (VERAMYST) 27.5 MCG/SPRAY nasal spray Place 2 sprays into the nose daily.     hydrALAZINE (APRESOLINE) 25 MG tablet TAKE 1 TABLET BY MOUTH THREE TIMES DAILY AS NEEDED( PRESSURE> 170) 90 tablet 0   HYDROcodone-acetaminophen (NORCO) 5-325 MG tablet Take 1 tablet by mouth every 6 (six) hours as needed.     latanoprost (XALATAN) 0.005 % ophthalmic solution 1 drop at bedtime.     levothyroxine (SYNTHROID) 112 MCG tablet TK 1 T PO ONCE D OES WITH A GLASS OF WATER AT LEAST 30-60 MINUTES B BRE     meloxicam (MOBIC) 15 MG  tablet TK 1 T PO ONCE D     methocarbamol (ROBAXIN) 500 MG tablet Take 500 mg by mouth 2 (two) times daily.     omeprazole (PRILOSEC) 20 MG capsule Take 1 capsule (20 mg total) by mouth 2 (two) times daily before a meal. 60 capsule 2   polyethylene glycol (MIRALAX / GLYCOLAX) packet Take 17 g by mouth daily.     potassium chloride SA (KLOR-CON M) 20 MEQ tablet Take 1 tablet (20 mEq total) by mouth daily for 7 days. 7 tablet 0   predniSONE (DELTASONE) 10 MG tablet Take 1 tablet by mouth daily.     Probiotic Product (PROBIOTIC DAILY PO) Take by mouth daily.     rosuvastatin (CRESTOR) 20 MG tablet TAKE 1 TABLET(20 MG) BY MOUTH AT BEDTIME 90 tablet 0   tamsulosin (FLOMAX) 0.4 MG CAPS capsule Take 1 capsule by mouth daily.     valACYclovir (VALTREX) 500 MG tablet Take 1 tablet by mouth 3 (three) times daily.     vitamin B-12  (CYANOCOBALAMIN) 1000 MCG tablet Take 1 tablet (1,000 mcg total) by mouth daily. 30 tablet 0   vitamin C (ASCORBIC ACID) 500 MG tablet Take 500 mg by mouth daily.     No current facility-administered medications for this visit.    Allergies as of 06/14/2022   (No Known Allergies)    ROS:  General: Negative for anorexia, weight loss, fever, chills, fatigue, weakness. ENT: Negative for hoarseness, difficulty swallowing , nasal congestion. CV: Negative for chest pain, angina, palpitations, dyspnea on exertion, peripheral edema.  Respiratory: Negative for dyspnea at rest, dyspnea on exertion, cough, sputum, wheezing.  GI: See history of present illness. GU:  Negative for dysuria, hematuria, urinary incontinence, urinary frequency, nocturnal urination.  Endo: Negative for unusual weight change.    Physical Examination:   Ht '5\' 9"'$  (1.753 m)   Wt 159 lb 9.6 oz (72.4 kg)   BMI 23.57 kg/m   General: Well-nourished, well-developed in no acute distress.  Eyes: No icterus. Conjunctivae pink. Mouth: Oropharyngeal mucosa moist and pink , no lesions erythema or exudate. Neuro: Alert and oriented x 3.  Grossly intact. Skin: Warm and dry, no jaundice.   Psych: Alert and cooperative, normal mood and affect.   Imaging Studies: DG C-Arm 1-60 Min-No Report  Result Date: 05/19/2022 Fluoroscopy was utilized by the requesting physician.  No radiographic interpretation.   PERIPHERAL VASCULAR CATHETERIZATION  Result Date: 05/19/2022 See surgical note for result.  CT Angio Abd/Pel w/ and/or w/o  Addendum Date: 05/19/2022   ADDENDUM REPORT: 05/19/2022 14:13 ADDENDUM: Case was discussed with GI team, with no colonic hemorrhage identified on colonoscopy, and positive findings of hematochezia of terminal ileum. FINDINGS: After review, the puddling/pooling of contrast is within loops of small bowel at the distal ileum/terminal ileum, however the terminal ileum loops are located right upper quadrant  between liver and the abdominal wall. IMPRESSION: After secondary review, CT angiogram is positive for evidence of acute GI hemorrhage, however not within hepatic flexure, but instead within distal ileal loops/terminal ileum. Distal small bowel loops are transposed into the right upper quadrant, superficial to the liver. Electronically Signed   By: Corrie Mckusick D.O.   On: 05/19/2022 14:13   Result Date: 05/19/2022 CLINICAL DATA:  88 year old male with GI bleeding EXAM: CTA ABDOMEN AND PELVIS WITHOUT AND WITH CONTRAST TECHNIQUE: Multidetector CT imaging of the abdomen and pelvis was performed using the standard protocol during bolus administration of intravenous contrast. Multiplanar reconstructed images and MIPs  were obtained and reviewed to evaluate the vascular anatomy. RADIATION DOSE REDUCTION: This exam was performed according to the departmental dose-optimization program which includes automated exposure control, adjustment of the mA and/or kV according to patient size and/or use of iterative reconstruction technique. CONTRAST:  137m OMNIPAQUE IOHEXOL 350 MG/ML SOLN COMPARISON:  07/16/2021, 05/17/2022 FINDINGS: VASCULAR Aorta: Atherosclerotic changes of the abdominal aorta. Tortuosity. No no wall thickening or inflammatory changes. Irregular plaque/chronic ulcerated plaque along the right aspect of the aorta, just inferior to the right renal artery takeoff. This results in maximum diameter of the aorta on the coronal images 25 mm. Celiac: Atherosclerotic changes without high-grade stenosis SMA: Atherosclerotic changes without high-grade stenosis Renals: - Right: Right renal artery patent. - Left: Atherosclerotic changes at the origin of the left renal artery, estimated 50% narrowing. IMA: Inferior mesenteric artery is patent. Right lower extremity: Tortuosity of the right iliac system. No aneurysm, dissection, or occlusion. Mild atherosclerosis. Hypogastric artery is patent. Common femoral artery patent.  Proximal SFA and profunda femoris patent. Left lower extremity: Tortuosity of the left iliac system. No aneurysm, dissection, or occlusion. Hypogastric artery is patent. Mild atherosclerosis. Common femoral artery patent. Proximal SFA and profunda femoris patent. Veins: Unremarkable appearance of the venous system. Review of the MIP images confirms the above findings. NON-VASCULAR Lower chest: No acute. Hepatobiliary: Unremarkable appearance of the liver. High density material within the gallbladder, potentially sludge/microlithiasis. Pancreas: Rounded low-density lesion within the pancreatic head associated with the ducts near the ampulla. No inflammatory changes. Spleen: Punctate calcifications, suggesting prior granulomatous disease. Adrenals/Urinary Tract: - Right adrenal gland: Unremarkable - Left adrenal gland: Unremarkable. - Right kidney: No hydronephrosis, nephrolithiasis, inflammation, or ureteral dilation. No focal lesion. - Left Kidney: No hydronephrosis, nephrolithiasis, inflammation, or ureteral dilation. No focal lesion. - Urinary Bladder: Urinary bladder partially distended. Diverticulum at the bladder base. Stomach/Bowel: - Stomach: Small hiatal hernia.  Otherwise unremarkable stomach. - Small bowel: Small bowel demonstrates no transition point, with mild gaseous distension compared to the prior. No extravasation of contrast or puddling of contrast. No wall thickening. Wall enhancement appears within normal limits. - Appendix: Appendix is not visualized, however, no inflammatory changes are present adjacent to the cecum to indicate an appendicitis. - Colon: Fluid-filled colon. Puddling of contrast present at the hepatic flexure. Questionable wall thickening of the ascending colon/hepatic flexure at this site persists. Left-sided diverticula again noted. Lymphatic: No adenopathy. Mesenteric: No free fluid or air. No mesenteric adenopathy. Reproductive: Transverse diameter of the prostate 34 mm.  Right-sided hydrocele. Other: Bilateral fat containing inguinal hernia. Musculoskeletal: Degenerative changes of the spine. No displaced fracture. No significant bony canal narrowing. No aggressive lytic or sclerotic lesions. IMPRESSION: CT angiogram positive for evidence of acute GI hemorrhage, with accumulation of extravasated contrast at the hepatic flexure. Associated wall thickening, suggesting acute inflammation/colitis. 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 Aortic Atherosclerosis (ICD10-I70.0). Additional ancillary findings as above. Signed, JDulcy Fanny WNadene Rubins RPVI [facility] Electronically Signed: By: JCorrie MckusickD.O. On: 05/19/2022 12:49   CT Angio Abd/Pel W and/or Wo Contrast  Result Date: 05/17/2022 CLINICAL DATA:  Diarrhea, GI bleed. EXAM: CTA ABDOMEN AND PELVIS WITHOUT AND WITH CONTRAST TECHNIQUE: Multidetector CT imaging of the abdomen and pelvis was performed using the standard protocol during bolus administration of intravenous contrast. Multiplanar reconstructed images and  MIPs were obtained and reviewed to evaluate the vascular anatomy. RADIATION DOSE REDUCTION: This exam was performed according to the departmental dose-optimization program which includes automated exposure control, adjustment of the mA and/or kV according to patient size and/or use of iterative reconstruction technique. CONTRAST:  35m OMNIPAQUE IOHEXOL 350 MG/ML SOLN COMPARISON:  CT abdomen dated 07/16/2021 FINDINGS: VASCULAR Aorta: Extensive aortic atherosclerosis, including ulcerated plaque of the ectatic infrarenal abdominal aorta. No aortic aneurysm or evidence of acute dissection. No significant stenosis. Celiac: Patent without evidence of aneurysm,  dissection, vasculitis or significant stenosis. SMA: Patent without evidence of aneurysm, dissection, vasculitis or significant stenosis. Atherosclerosis at the origin and at the proximal branching of the SMA, but normal contrast flow is seen into the more distal branches without evidence of a hemodynamically significant stenosis. Renals: Atherosclerosis at the bilateral ostia, but normal flow into the renal arteries and distal branches without evidence of a hemodynamically significant stenosis. IMA: Patent without evidence of aneurysm, dissection, vasculitis or significant stenosis. Inflow: Patent without evidence of aneurysm, dissection, vasculitis or significant stenosis. Proximal Outflow: Bilateral common femoral and visualized portions of the superficial and profunda femoral arteries are patent without evidence of aneurysm, dissection, vasculitis or significant stenosis. Veins: No obvious venous abnormality within the limitations of this arterial phase study. Review of the MIP images confirms the above findings. NON-VASCULAR Lower chest: No acute abnormality. Hepatobiliary: No focal liver abnormality is seen. No gallstones, gallbladder wall thickening, or biliary dilatation. Pancreas: Unremarkable. No pancreatic ductal dilatation or surrounding inflammatory changes. Spleen: Normal in size without focal abnormality. Adrenals/Urinary Tract: Adrenal glands are unremarkable. Kidneys appear normal without mass, stone or hydronephrosis. No perinephric fluid. No ureteral or bladder calculi are identified. Bladder is unremarkable. Stomach/Bowel: No dilated large or small bowel loops. Mild thickening of the walls of the ascending colon, with subtle pericolonic inflammation/fluid stranding. Appendix is not seen but there are no focal inflammatory changes about the cecum to suggest acute appendicitis. No active hemorrhage is identified within the large or small bowel. Scattered diverticulosis within the sigmoid colon but  no focal inflammatory change to suggest acute diverticulitis. Stomach is unremarkable, partially decompressed. Lymphatic: No enlarged lymph nodes are seen. Reproductive: Prostate is unremarkable. Other: Trace free fluid in the lower pelvis. No additional free fluid is seen. No abscess collection. No free intraperitoneal air. Musculoskeletal: No acute-appearing osseous abnormality. Degenerative spondylosis of the slightly scoliotic thoracolumbar spine, mild to moderate in degree. IMPRESSION: 1. No active hemorrhage is identified within the large or small bowel. 2. Mild thickening of the walls of the ascending colon, with subtle pericolonic inflammation/fluid stranding, suspicious for a mild colitis of infectious or inflammatory nature. 3. Colonic diverticulosis without evidence of acute diverticulitis. Aortic Atherosclerosis (ICD10-I70.0). Electronically Signed   By: SFranki CabotM.D.   On: 05/17/2022 09:26    Assessment and Plan:   RFlynn Gwynis a 88y.o. y/o male here to follow-up after hospital discharge when he was admitted with iron deficiency anemia with EGD showing no abnormalities colonoscopy showing bright red blood in the terminal ileum subsequently had a CT angiogram showed brisk bleed from the terminal ileum which was subsequently embolized.  Labs determined he had severe iron deficiency as well as B12 deficiency he has been on supplementation and his hemoglobin has improved significantly to 11.6 g with an MCV of 96 presently.  EGD also demonstrated medium size hiatal hernia grade a esophagitis.  Plan 1.  Continue iron and B12 supplementation.  Recommend repeat iron studies and B12  levels and CBC in 6 to 8 weeks 2.   He had a Schatzki's ring and esophagitis during his last EGD treated with PPI would recommend repeat EGD and if needed dilation of the Schatzki's ring following which we will perform an capsule study of the small bowel in the same setting I will also perform a colonoscopy at the  same time as he will be had impaired visualization of the colonic mucosa during recent hospitalization due to large quantity of blood seen throughout the colon.  AVMs which can cause iron deficiency anemia may have been missed.   I have discussed alternative options, risks & benefits,  which include, but are not limited to, bleeding, infection, perforation,respiratory complication & drug reaction.  The patient agrees with this plan & written consent will be obtained.   Risks, benefits, alternatives of Givens capsule discussed with patient to include but not limited to the rare risk of Given's capsule becoming lodged in the GI tract requiring surgical removal.  The patient agrees with this plan & consent will be obtained.     Dr Brett Bellows  MD,MRCP [facility]) Follow up in 3 months

## 2022-06-16 NOTE — Progress Notes (Signed)
Date:  06/17/2022   ID:  Brett Ray, DOB 1931/10/31, MRN [id number]  Patient Location:  [place] [address]   Provider location:   [facility], [place] office  PCP:  Derinda Late, MD  Cardiologist:  Patsy Baltimore  Chief Complaint  Patient presents with   12 month follow up     "Doing well." Medications reviewed by the patient verbally.     History of Present Illness:    Brett Ray is a 88 y.o. male  past medical history of Hypokalemia Depression Hypothyroid Hx of syncope after flomax, viagra, ETOH  09/2018 Chronic asymptomatic bradycardia Long hx of rare orthostasis calcium score 900 in 05/2019 Chronic dizziness Presents for f/u of his syncope, orthostasis, coronary calcification  Last seen in clinic August 2022 Recent hospital records reviewed In the hospital early July 2023 diarrhea, dark stool, GI bleed Testing as below CTA of the abdomen pelvis negative for acute bleeding.   EGD and colonoscopy with no source of bleeding, bleeding from the small intestine. Repeat CT angiogram with bleeding from hepatic flexure area Selective angiogram and coil embolization SMA branch by vascular surgery with clinical improvement. Hemoglobin down to 6.9 -Received total 5 units of PRBC.  Placed on iron at discharge May 22, 2022  Lab work reviewed 3 months ago Total cholesterol 170 LDL 57, down from 230 before Crestor Creatinine 1.0 BUN 12 normal LFTs TSH 11.0 up from 0.16 Hemoglobin 11.6 on June 13, 2022  Has been taking hydralazine daily in the evening  Blood pressure has not been running high  Reports that he continues to do his 1 hour of exercise every morning Denies any chest pain or shortness of breath concerning for angina Walks well, good balance, No recent near-syncope or syncope Careful when he stands up  No side effects to the Crestor  CT coronary calcium scoreof 907.  This was 61% percentile for age and  sex matched control.  Mildly dilated ascending aorta  EKG personally reviewed by myself on todays visit Shows normal sinus rhythm rate 61 bpm LVH right bundle branch block  Previous orthostatics done in the office drop in blood pressure 169 down to 138 with standing, no significant change in heart rate  Other past medical history reviewed  hospital November 2019 for syncope Felt flush after drinking several glasses of wine on empty stomach With sitting, tried to get up to go to the bathroom had acute syncope EMS called BP-91/56 initially,   Sat him up in a chair had syncope again He reported having Viagra 40 mg prior to the party, was also on Flomax Bradycardia rate 48 Reports having chronic bradycardia, typically is asymptomatic   Echocardiogram November 2019 Left ventricle: The cavity size was mildly dilated. Wall   thickness was normal. Systolic function was normal. The estimated   ejection fraction was in the range of 55% to 60%. Wall motion was   normal; there were no regional wall motion abnormalities. Left   ventricular diastolic function parameters were normal. - Aortic valve: There was mild regurgitation. - Mitral valve: There was moderate regurgitation. - Right ventricle: The cavity size was mildly dilated. Wall   thickness was normal. - Right atrium: The atrium was mildly dilated. - Pulmonary arteries: Systolic pressure was mildly to moderately   increased. PA peak pressure: 45 mm Hg (S).     Past Medical History:  Diagnosis Date   Asymptomatic Sinus bradycardia    Hyperlipidemia  Hypothyroidism    Moderate mitral regurgitation    a. 09/2018 Echo: Mod MR.   Orthostasis    Syncope    a. 09/2018 syncope in the setting of wine/empty stomach/sildenafil/orthostasis; b. 09/2018 Echo: EF 55-60%, no rwma, mild AI, mod MR, mildly dil RV/RA. PASP 18mHg; c. 09/2018 Carotid U/S: <39% bilat ICA dzs.   Past Surgical History:  Procedure Laterality Date   APPENDECTOMY      COLONOSCOPY WITH PROPOFOL N/A 05/19/2022   Procedure: COLONOSCOPY WITH PROPOFOL;  Surgeon: AJonathon Bellows MD;  [facility];  Service: Gastroenterology;  Laterality: N/A;   ESOPHAGOGASTRODUODENOSCOPY (EGD) WITH PROPOFOL N/A 05/19/2022   Procedure: ESOPHAGOGASTRODUODENOSCOPY (EGD) WITH PROPOFOL;  Surgeon: AJonathon Bellows MD;  [facility];  Service: Gastroenterology;  Laterality: N/A;   SHOULDER ARTHROSCOPY     VISCERAL ARTERY INTERVENTION N/A 05/19/2022   Procedure: VISCERAL ARTERY INTERVENTION;  Surgeon: SKatha Cabal MD;  [facility];  Service: Cardiovascular;  Laterality: N/A;     Current Meds  Medication Sig   calcium carbonate (TUMS - DOSED IN MG ELEMENTAL CALCIUM) 500 MG chewable tablet Chew 1 tablet by mouth 2 (two) times daily as needed for indigestion or heartburn.   citalopram (CELEXA) 10 MG tablet Take 1 tablet by mouth daily.   ferrous sulfate 325 (65 FE) MG tablet Take 1 tablet (325 mg total) by mouth daily with breakfast.   finasteride (PROSCAR) 5 MG tablet Take 5 mg by mouth daily.   fluticasone (VERAMYST) 27.5 MCG/SPRAY nasal spray Place 2 sprays into the nose daily.   hydrALAZINE (APRESOLINE) 25 MG tablet TAKE 1 TABLET BY MOUTH THREE TIMES DAILY AS NEEDED( PRESSURE> 170)   HYDROcodone-acetaminophen (NORCO) 5-325 MG tablet Take 1 tablet by mouth every 6 (six) hours as needed.   latanoprost (XALATAN) 0.005 % ophthalmic solution 1 drop at bedtime.   levothyroxine (SYNTHROID) 137 MCG tablet Take 137 mcg by mouth daily before breakfast.   omeprazole (PRILOSEC) 20 MG capsule Take 1 capsule (20 mg total) by mouth 2 (two) times daily before a meal.   Pseudoephedrine-Acetaminophen (PSEUDOEPHEDRINE-APAP PO) Take 30 mg by mouth in the morning and at bedtime.   rosuvastatin (CRESTOR) 20 MG tablet TAKE 1 TABLET(20 MG) BY MOUTH AT BEDTIME     Allergies:   Patient has no known allergies.   Social History   Tobacco Use   Smoking status: Never    Smokeless tobacco: Never  Vaping Use   Vaping Use: Never used  Substance Use Topics   Alcohol use: Yes    Alcohol/week: 2.0 standard drinks of alcohol    Types: 2 Glasses of wine per week    Comment: every evening   Drug use: Never     Family Hx: The patient's family history is not on file.  ROS:   Please see the history of present illness.    Review of Systems  Constitutional: Negative.   HENT: Negative.    Respiratory: Negative.    Cardiovascular: Negative.   Gastrointestinal: Negative.   Musculoskeletal: Negative.   Neurological: Negative.   Psychiatric/Behavioral: Negative.    All other systems reviewed and are negative.    Labs/Other Tests and Data Reviewed:    Recent Labs: 05/22/2022: ALT 22; BUN 6; Creatinine, Ser 0.61; Hemoglobin 6.9; Magnesium 2.1; Platelets 210; Potassium 3.4; Sodium 138   Recent Lipid Panel No results found for: "CHOL", "TRIG", "HDL", "CHOLHDL", "LDLCALC", "LDLDIRECT"   Exam:    Vital Signs: Vital signs may also be detailed in the  HPI BP 120/64 (BP Location: Left Arm, Patient Position: Sitting, Cuff Size: Normal)   Pulse 61   Ht '5\' 9"'$  (1.753 m)   Wt 159 lb 8 oz (72.3 kg)   SpO2 98%   BMI 23.55 kg/m   Constitutional:  oriented to person, place, and time. No distress.  HENT:  Head: Grossly normal Eyes:  no discharge. No scleral icterus.  Neck: No JVD, no carotid bruits  Cardiovascular: Regular rate and rhythm, no murmurs appreciated Pulmonary/Chest: Clear to auscultation bilaterally, no wheezes or rails Abdominal: Soft.  no distension.  no tenderness.  Musculoskeletal: Normal range of motion Neurological:  normal muscle tone. Coordination normal. No atrophy Skin: Skin warm and dry Psychiatric: normal affect, pleasant  ASSESSMENT & PLAN:    Coronary calcification on CT scan On Crestor, cholesterol slightly above goal Recommend once his GI issues resolve we could add Zetia 10 mg daily to achieve goal total cholesterol less than  150  Chronic dizziness Relatively well-controlled symptoms Recommend he can stop hydralazine if blood pressure is not high Today well controlled  Essential hypertension Previously orthostatic in the office We have avoided aggressive blood pressure control to minimize orthostatic symptoms For days with persistently elevated blood pressure,recommended he take hydralazine 25 mg as needed for systolic pressure over 024-097  Bradycardia -  Asymptomatic, rate well controlled on today's visit  Orthostasis - asymptomatic, aggressive hydration Currently not on any medications  Syncope, unspecified syncope type -  No recent episodes,  Hydralazine only as needed, blood pressure stable, stay hydrated   Total encounter time more than 30 minutes  Greater than 50% was spent in counseling and coordination of care with the patient   Signed, Ida Rogue, MD  06/17/2022 9:03 AM    [place] [facility] [address], [place], [place] [address]

## 2022-06-17 ENCOUNTER — Ambulatory Visit: Payer: Medicare Other | Admitting: Cardiovascular Disease

## 2022-06-17 ENCOUNTER — Encounter: Payer: Self-pay | Admitting: Cardiovascular Disease

## 2022-06-17 VITALS — BP 120/64 | HR 61 | Ht 69.0 in | Wt 159.5 lb

## 2022-06-17 DIAGNOSIS — E782 Mixed hyperlipidemia: Secondary | ICD-10-CM | POA: Diagnosis not present

## 2022-06-17 DIAGNOSIS — R55 Syncope and collapse: Secondary | ICD-10-CM

## 2022-06-17 DIAGNOSIS — I25118 Atherosclerotic heart disease of native coronary artery with other forms of angina pectoris: Secondary | ICD-10-CM

## 2022-06-17 DIAGNOSIS — R42 Dizziness and giddiness: Secondary | ICD-10-CM

## 2022-06-17 DIAGNOSIS — I951 Orthostatic hypotension: Secondary | ICD-10-CM

## 2022-06-17 DIAGNOSIS — R001 Bradycardia, unspecified: Secondary | ICD-10-CM | POA: Diagnosis not present

## 2022-06-17 DIAGNOSIS — I10 Essential (primary) hypertension: Secondary | ICD-10-CM | POA: Diagnosis not present

## 2022-06-17 NOTE — Patient Instructions (Addendum)
Medication Instructions:  Hydralazine only as needed for pressure >160 sustained   Call when stomach is better, We could try zetia/ezetimibe  If you need a refill on your cardiac medications before your next appointment, please call your pharmacy.   Lab work: No new labs needed  Testing/Procedures: No new testing needed  Follow-Up: At [facility], you and your health needs are our priority.  As part of our continuing mission to provide you with exceptional heart care, we have created designated Provider Care Teams.  These Care Teams include your primary Cardiologist (physician) and Advanced Practice Providers (APPs -  Physician Assistants and Nurse Practitioners) who all work together to provide you with the care you need, when you need it.  You will need a follow up appointment in 12 months  Providers on your designated Care Team:   Murray Hodgkins, NP Christell Faith, PA-C Cadence Kathlen Mody, [place]  COVID-19 Vaccine Information can be found at: [email or web address] For questions related to vaccine distribution or appointments, please email [email or web address] or call [phone].

## 2022-07-13 ENCOUNTER — Encounter
Admission: RE | Disposition: A | Discharge status: Still In Hospital | Payer: Self-pay | Source: Ambulatory Visit | Attending: Gastroenterology

## 2022-07-13 ENCOUNTER — Ambulatory Visit: Payer: Medicare Other | Admitting: Anesthesiology

## 2022-07-13 ENCOUNTER — Ambulatory Visit
Admission: RE | Admit: 2022-07-13 | Discharge: 2022-07-13 | Disposition: A | Discharge status: Home / Self Care | Payer: Medicare Other | Source: Ambulatory Visit | Attending: Gastroenterology | Admitting: Gastroenterology

## 2022-07-13 ENCOUNTER — Encounter
Admission: RE | Disposition: A | Discharge status: Home / Self Care | Payer: Self-pay | Source: Ambulatory Visit | Attending: Gastroenterology

## 2022-07-13 ENCOUNTER — Ambulatory Visit
Admission: RE | Admit: 2022-07-13 | Discharge: 2022-07-13 | Disposition: A | Discharge status: Still In Hospital | Payer: Medicare Other | Source: Ambulatory Visit | Attending: Gastroenterology | Admitting: Gastroenterology

## 2022-07-13 DIAGNOSIS — E039 Hypothyroidism, unspecified: Secondary | ICD-10-CM | POA: Insufficient documentation

## 2022-07-13 DIAGNOSIS — K5730 Diverticulosis of large intestine without perforation or abscess without bleeding: Secondary | ICD-10-CM | POA: Diagnosis not present

## 2022-07-13 DIAGNOSIS — D122 Benign neoplasm of ascending colon: Secondary | ICD-10-CM

## 2022-07-13 DIAGNOSIS — K222 Esophageal obstruction: Secondary | ICD-10-CM

## 2022-07-13 DIAGNOSIS — K219 Gastro-esophageal reflux disease without esophagitis: Secondary | ICD-10-CM | POA: Insufficient documentation

## 2022-07-13 DIAGNOSIS — D649 Anemia, unspecified: Secondary | ICD-10-CM

## 2022-07-13 DIAGNOSIS — E785 Hyperlipidemia, unspecified: Secondary | ICD-10-CM | POA: Insufficient documentation

## 2022-07-13 DIAGNOSIS — D509 Iron deficiency anemia, unspecified: Secondary | ICD-10-CM

## 2022-07-13 DIAGNOSIS — K921 Melena: Secondary | ICD-10-CM | POA: Diagnosis not present

## 2022-07-13 DIAGNOSIS — K922 Gastrointestinal hemorrhage, unspecified: Secondary | ICD-10-CM | POA: Insufficient documentation

## 2022-07-13 DIAGNOSIS — I10 Essential (primary) hypertension: Secondary | ICD-10-CM | POA: Insufficient documentation

## 2022-07-13 DIAGNOSIS — F32A Depression, unspecified: Secondary | ICD-10-CM | POA: Insufficient documentation

## 2022-07-13 HISTORY — PX: COLONOSCOPY: SHX5424

## 2022-07-13 HISTORY — PX: ESOPHAGOGASTRODUODENOSCOPY: SHX5428

## 2022-07-13 HISTORY — PX: GIVENS CAPSULE STUDY: SHX5432

## 2022-07-13 SURGERY — IMAGING PROCEDURE, GI TRACT, INTRALUMINAL, VIA CAPSULE
Anesthesia: General

## 2022-07-13 SURGERY — EGD (ESOPHAGOGASTRODUODENOSCOPY)
Anesthesia: General

## 2022-07-13 MED ORDER — PROPOFOL 500 MG/50ML IV EMUL
INTRAVENOUS | Status: DC | PRN
  Administered 2022-07-13: 100 ug/kg/min via INTRAVENOUS

## 2022-07-13 MED ORDER — PROPOFOL 10 MG/ML IV BOLUS
INTRAVENOUS | Status: DC | PRN
  Administered 2022-07-13 (×3): 50 mg via INTRAVENOUS

## 2022-07-13 MED ORDER — MIDAZOLAM HCL 2 MG/2ML IJ SOLN
INTRAMUSCULAR | Status: DC | PRN
  Administered 2022-07-13: 2 mg via INTRAVENOUS

## 2022-07-13 MED ORDER — PROPOFOL 1000 MG/100ML IV EMUL
INTRAVENOUS | Status: AC
  Filled 2022-07-13: qty 100

## 2022-07-13 MED ORDER — PROPOFOL 10 MG/ML IV BOLUS
INTRAVENOUS | Status: DC | PRN
  Administered 2022-07-13: 50 mg via INTRAVENOUS
  Administered 2022-07-13: 40 mg via INTRAVENOUS

## 2022-07-13 MED ORDER — SODIUM CHLORIDE 0.9 % IV SOLN
INTRAVENOUS | Status: DC
  Administered 2022-07-13: 10 mL/h via INTRAVENOUS

## 2022-07-13 MED ORDER — SODIUM CHLORIDE 0.9 % IV SOLN
INTRAVENOUS | Status: DC
  Administered 2022-07-13: 20 mL/h via INTRAVENOUS

## 2022-07-13 MED ORDER — MIDAZOLAM HCL 2 MG/2ML IJ SOLN
INTRAMUSCULAR | Status: AC
  Filled 2022-07-13: qty 2

## 2022-07-13 MED ORDER — SODIUM CHLORIDE 0.9 % IV SOLN: INTRAVENOUS | Status: DC

## 2022-07-13 MED ORDER — SODIUM CHLORIDE 0.9 % IV SOLN: INTRAVENOUS | Status: DC | PRN

## 2022-07-13 MED ORDER — LIDOCAINE HCL (CARDIAC) PF 100 MG/5ML IV SOSY
PREFILLED_SYRINGE | INTRAVENOUS | Status: DC | PRN
  Administered 2022-07-13: 30 mg via INTRAVENOUS

## 2022-07-13 NOTE — Op Note (Signed)
[facility] Gastroenterology Patient Name: Brett Ray Procedure Date: 07/13/2022 7:30 AM MRN: [id number] Account #: [id number] Date of Birth: 03/13/1931 Admit Type: Outpatient Age: 88 Room: [facility] ENDO ROOM 3 Gender: Male Note Status: Finalized Instrument Name: Jasper Riling [id number] Procedure:             Colonoscopy Indications:           Melena Providers:             [name] MD, MD Referring MD:          Caprice Renshaw MD (Referring MD) Medicines:             Monitored Anesthesia Care Complications:         No immediate complications. Procedure:             Pre-Anesthesia Assessment:                        - Prior to the procedure, a History and Physical was                         performed, and patient medications, allergies and                         sensitivities were reviewed. The patient's tolerance                         of previous anesthesia was reviewed.                        - The risks and benefits of the procedure and the                         sedation options and risks were discussed with the                         patient. All questions were answered and informed                         consent was obtained.                        - ASA Grade Assessment: III - A patient with severe                         systemic disease.                        After obtaining informed consent, the colonoscope was                         passed under direct vision. Throughout the procedure,                         the patient's blood pressure, pulse, and oxygen                         saturations were monitored continuously. The                         Colonoscope was introduced through the anus  and                         advanced to the the cecum, identified by the                         appendiceal orifice. The colonoscopy was performed                         with ease. The patient tolerated the procedure well.                         The  quality of the bowel preparation was excellent. Findings:      The perianal and digital rectal examinations were normal.      A 12 mm polyp was found in the ascending colon. The polyp was sessile.       The polyp was removed with a cold snare. Resection and retrieval were       complete. To prevent bleeding after the polypectomy, two hemostatic       clips were successfully placed. There was no bleeding during, or at the       end, of the procedure.      Scattered small and large-mouthed diverticula were found in the sigmoid       colon.      The exam was otherwise without abnormality on direct and retroflexion       views. Impression:            - One 12 mm polyp in the ascending colon, removed with                         a cold snare. Resected and retrieved. Clips were                         placed.                        - Diverticulosis in the sigmoid colon.                        - The examination was otherwise normal on direct and                         retroflexion views. Recommendation:        - Discharge patient to home (with escort).                        - Resume previous diet.                        - Continue present medications.                        - Await pathology results.                        - Repeat colonoscopy is not recommended due to current                         age (66 years or older) for surveillance. Procedure Code(s):     --- Professional ---  45385, Colonoscopy, flexible; with removal of                         tumor(s), polyp(s), or other lesion(s) by snare                         technique Diagnosis Code(s):     --- Professional ---                        K63.5, Polyp of colon                        K92.1, Melena (includes Hematochezia)                        K57.30, Diverticulosis of large intestine without                         perforation or abscess without bleeding CPT copyright 2019 American Medical Association.  All rights reserved. The codes documented in this report are preliminary and upon coder review may  be revised to meet current compliance requirements. [name], MD [name] MD, MD 07/13/2022 8:24:25 AM This report has been signed electronically. Number of Addenda: 0 Note Initiated On: 07/13/2022 7:30 AM Scope Withdrawal Time: 0 hours 14 minutes 1 second  Total Procedure Duration: 0 hours 19 minutes 45 seconds  Estimated Blood Loss:  Estimated blood loss: none.      [facility]

## 2022-07-13 NOTE — Transfer of Care (Signed)
Immediate Anesthesia Transfer of Care Note  Patient: Brett Ray  Procedure(s) Performed: GIVENS CAPSULE STUDY COLONOSCOPY ESOPHAGOGASTRODUODENOSCOPY (EGD)  Patient Location: PACU and Endoscopy Unit  Anesthesia Type:MAC  Level of Consciousness: awake  Airway & Oxygen Therapy: Patient Spontanous Breathing  Post-op Assessment: Report given to RN and Post -op Vital signs reviewed and stable  Post vital signs: Reviewed and stable  Last Vitals:  Vitals Value Taken Time  BP 147/62 07/13/22 0827  Temp 36 C 07/13/22 0827  Pulse 53 07/13/22 0829  Resp 15 07/13/22 0829  SpO2 100 % 07/13/22 0829  Vitals shown include unvalidated device data.  Last Pain:  Vitals:   07/13/22 0827  TempSrc: Tympanic  PainSc: Asleep         Complications: No notable events documented.

## 2022-07-13 NOTE — Transfer of Care (Addendum)
Immediate Anesthesia Transfer of Care Note  Patient: Brett Ray  Procedure(s) Performed: ESOPHAGOGASTRODUODENOSCOPY (EGD)  Patient Location: PACU and Endoscopy Unit  Anesthesia Type:MAC  Level of Consciousness: awake  Airway & Oxygen Therapy: Patient Spontanous Breathing  Post-op Assessment: Report given to RN and Post -op Vital signs reviewed and stable  Post vital signs: Reviewed and stable  Last Vitals:  Vitals Value Taken Time  BP    Temp    Pulse    Resp    SpO2      Last Pain: There were no vitals filed for this visit.       Complications: No notable events documented.

## 2022-07-13 NOTE — Anesthesia Preprocedure Evaluation (Signed)
Anesthesia Evaluation  Patient identified by MRN, date of birth, ID band Patient awake    Reviewed: Allergy & Precautions, H&P , NPO status , Patient's Chart, lab work & pertinent test results, reviewed documented beta blocker date and time   History of Anesthesia Complications Negative for: history of anesthetic complications  Airway Mallampati: I  TM Distance: >3 FB Neck ROM: full    Dental no notable dental hx.    Pulmonary neg pulmonary ROS, neg shortness of breath,    Pulmonary exam normal        Cardiovascular Exercise Tolerance: Good hypertension, (-) anginaNormal cardiovascular exam+ Valvular Problems/Murmurs MR   Sees cardiology lastly 06/2022    Neuro/Psych PSYCHIATRIC DISORDERS Depression negative neurological ROS     GI/Hepatic Neg liver ROS, GERD  ,  Endo/Other  neg diabetesHypothyroidism   Renal/GU negative Renal ROS  negative genitourinary   Musculoskeletal   Abdominal Normal abdominal exam  (+)   Peds  Hematology  (+) Blood dyscrasia, anemia ,   Anesthesia Other Findings Hypokalemia Depression Hypothyroid Hx of syncope after flomax, viagra, ETOH  09/2018 Chronic asymptomatic bradycardia Long hx of rare orthostasis calcium score 900 in 05/2019 Chronic dizziness   Past Medical History: No date: Asymptomatic Sinus bradycardia No date: Hyperlipidemia No date: Hypothyroidism No date: Moderate mitral regurgitation     Comment:  a. 09/2018 Echo: Mod MR. No date: Orthostasis No date: Syncope     Comment:  a. 09/2018 syncope in the setting of wine/empty               stomach/sildenafil/orthostasis; b. 09/2018 Echo: EF               55-60%, no rwma, mild AI, mod MR, mildly dil RV/RA. PASP               21mHg; c. 09/2018 Carotid U/S: <39% bilat ICA dzs.   Reproductive/Obstetrics negative OB ROS                             Anesthesia Physical  Anesthesia Plan  ASA:  2  Anesthesia Plan: General   Post-op Pain Management: Minimal or no pain anticipated   Induction: Intravenous  PONV Risk Score and Plan: TIVA, Treatment may vary due to age or medical condition and Propofol infusion  Airway Management Planned: Natural Airway and Nasal Cannula  Additional Equipment:   Intra-op Plan:   Post-operative Plan:   Informed Consent: I have reviewed the patients History and Physical, chart, labs and discussed the procedure including the risks, benefits and alternatives for the proposed anesthesia with the patient or authorized representative who has indicated his/her understanding and acceptance.     Dental Advisory Given  Plan Discussed with: Anesthesiologist, CRNA and Surgeon  Anesthesia Plan Comments:         Anesthesia Quick Evaluation

## 2022-07-13 NOTE — Anesthesia Preprocedure Evaluation (Addendum)
Anesthesia Evaluation  Patient identified by MRN, date of birth, ID band Patient awake    Reviewed: Allergy & Precautions, H&P , NPO status , Patient's Chart, lab work & pertinent test results, reviewed documented beta blocker date and time   History of Anesthesia Complications Negative for: history of anesthetic complications  Airway Mallampati: I  TM Distance: >3 FB Neck ROM: full    Dental no notable dental hx.    Pulmonary neg pulmonary ROS, neg shortness of breath,    Pulmonary exam normal        Cardiovascular Exercise Tolerance: Good hypertension, (-) anginaNormal cardiovascular exam+ Valvular Problems/Murmurs MR   Sees cardiology lastly 06/2022    Neuro/Psych PSYCHIATRIC DISORDERS Depression negative neurological ROS     GI/Hepatic Neg liver ROS, GERD  ,  Endo/Other  neg diabetesHypothyroidism   Renal/GU negative Renal ROS  negative genitourinary   Musculoskeletal   Abdominal Normal abdominal exam  (+)   Peds  Hematology  (+) Blood dyscrasia, anemia ,   Anesthesia Other Findings Hypokalemia Depression Hypothyroid Hx of syncope after flomax, viagra, ETOH  09/2018 Chronic asymptomatic bradycardia Long hx of rare orthostasis calcium score 900 in 05/2019 Chronic dizziness   Past Medical History: No date: Asymptomatic Sinus bradycardia No date: Hyperlipidemia No date: Hypothyroidism No date: Moderate mitral regurgitation     Comment:  a. 09/2018 Echo: Mod MR. No date: Orthostasis No date: Syncope     Comment:  a. 09/2018 syncope in the setting of wine/empty               stomach/sildenafil/orthostasis; b. 09/2018 Echo: EF               55-60%, no rwma, mild AI, mod MR, mildly dil RV/RA. PASP               3mHg; c. 09/2018 Carotid U/S: <39% bilat ICA dzs.   Reproductive/Obstetrics negative OB ROS                           Anesthesia Physical  Anesthesia Plan  ASA:  2  Anesthesia Plan: General   Post-op Pain Management: Minimal or no pain anticipated   Induction: Intravenous  PONV Risk Score and Plan: TIVA, Treatment may vary due to age or medical condition and Propofol infusion  Airway Management Planned: Natural Airway and Nasal Cannula  Additional Equipment:   Intra-op Plan:   Post-operative Plan:   Informed Consent: I have reviewed the patients History and Physical, chart, labs and discussed the procedure including the risks, benefits and alternatives for the proposed anesthesia with the patient or authorized representative who has indicated his/her understanding and acceptance.     Dental Advisory Given  Plan Discussed with: Anesthesiologist, CRNA and Surgeon  Anesthesia Plan Comments:        Anesthesia Quick Evaluation

## 2022-07-13 NOTE — Anesthesia Postprocedure Evaluation (Signed)
Anesthesia Post Note  Patient: Brett Ray  Procedure(s) Performed: ESOPHAGOGASTRODUODENOSCOPY (EGD)  Patient location during evaluation: Endoscopy Anesthesia Type: General Level of consciousness: awake and alert Pain management: pain level controlled Vital Signs Assessment: post-procedure vital signs reviewed and stable Respiratory status: spontaneous breathing, nonlabored ventilation and respiratory function stable Cardiovascular status: blood pressure returned to baseline and stable Postop Assessment: no apparent nausea or vomiting Anesthetic complications: no   No notable events documented.   Last Vitals:  Vitals:   07/13/22 1235 07/13/22 1245  BP:  (!) 157/85  Pulse: (!) 56 (!) 56  Resp:  16  Temp:    SpO2: 95% 96%    Last Pain:  Vitals:   07/13/22 1245  TempSrc:   PainSc: 0-No pain                 [place] Ouch

## 2022-07-13 NOTE — H&P (Signed)
Brett Bellows, MD [address] [place], [place], [place], [address] [address], [place], [place], [place], [address] Phone: [phone]  Fax: [phone]  Primary Care Physician:  Derinda Late, MD   Pre-Procedure History & Physical: HPI:  Brett Ray is a 88 y.o. male is here for an endoscopy    Past Medical History:  Diagnosis Date   Asymptomatic Sinus bradycardia    Hyperlipidemia    Hypothyroidism    Moderate mitral regurgitation    a. 09/2018 Echo: Mod MR.   Orthostasis    Syncope    a. 09/2018 syncope in the setting of wine/empty stomach/sildenafil/orthostasis; b. 09/2018 Echo: EF 55-60%, no rwma, mild AI, mod MR, mildly dil RV/RA. PASP 50mHg; c. 09/2018 Carotid U/S: <39% bilat ICA dzs.    Past Surgical History:  Procedure Laterality Date   APPENDECTOMY     COLONOSCOPY WITH PROPOFOL N/A 05/19/2022   Procedure: COLONOSCOPY WITH PROPOFOL;  Surgeon: AJonathon Bellows MD;  [facility];  Service: Gastroenterology;  Laterality: N/A;   ESOPHAGOGASTRODUODENOSCOPY (EGD) WITH PROPOFOL N/A 05/19/2022   Procedure: ESOPHAGOGASTRODUODENOSCOPY (EGD) WITH PROPOFOL;  Surgeon: AJonathon Bellows MD;  [facility];  Service: Gastroenterology;  Laterality: N/A;   SHOULDER ARTHROSCOPY     VISCERAL ARTERY INTERVENTION N/A 05/19/2022   Procedure: VISCERAL ARTERY INTERVENTION;  Surgeon: SKatha Cabal MD;  [facility];  Service: Cardiovascular;  Laterality: N/A;    Prior to Admission medications   Medication Sig Start Date End Date Taking? Authorizing Provider  calcium carbonate (TUMS - DOSED IN MG ELEMENTAL CALCIUM) 500 MG chewable tablet Chew 1 tablet by mouth 2 (two) times daily as needed for indigestion or heartburn.    [provider]  chlorpheniramine-HYDROcodone 10-8 MG/5ML Take 5 mLs by mouth every 12 (twelve) hours as needed. Patient not taking: Reported on 06/17/2022    [provider]  citalopram (CELEXA) 10 MG tablet  Take 1 tablet by mouth daily. 08/28/18   [provider]  ferrous sulfate 325 (65 FE) MG tablet Take 1 tablet (325 mg total) by mouth daily with breakfast. 05/23/22 08/21/22  GBarb Merino MD  finasteride (PROSCAR) 5 MG tablet Take 5 mg by mouth daily.    [provider]  fluticasone (VERAMYST) 27.5 MCG/SPRAY nasal spray Place 2 sprays into the nose daily.    [provider]  hydrALAZINE (APRESOLINE) 25 MG tablet TAKE 1 TABLET BY MOUTH THREE TIMES DAILY AS NEEDED( PRESSURE> 170) 03/14/22   Gollan, TKathlene November MD  HYDROcodone-acetaminophen (NORCO) 5-325 MG tablet Take 1 tablet by mouth every 6 (six) hours as needed. 06/26/20   [provider]  latanoprost (XALATAN) 0.005 % ophthalmic solution 1 drop at bedtime. 05/19/21   [provider]  levothyroxine (SYNTHROID) 112 MCG tablet Take 137 mcg by mouth daily before breakfast. Patient not taking: Reported on 06/17/2022    [provider]  levothyroxine (SYNTHROID) 137 MCG tablet Take 137 mcg by mouth daily before breakfast.    [provider]  omeprazole (PRILOSEC) 20 MG capsule Take 1 capsule (20 mg total) by mouth 2 (two) times daily before a meal. 05/22/22 08/20/22  GBarb Merino MD  Pseudoephedrine-Acetaminophen (PSEUDOEPHEDRINE-APAP PO) Take 30 mg by mouth in the morning and at bedtime.    [provider]  rosuvastatin (CRESTOR) 20 MG tablet TAKE 1 TABLET(20 MG) BY MOUTH AT BEDTIME 04/27/22   GMinna Merritts MD  valACYclovir (VALTREX) 500 MG tablet Take 1 tablet by mouth 3 (three)  times daily. Patient not taking: Reported on 06/14/2022    [provider]    Allergies as of 07/13/2022   (No Known Allergies)    No family history on file.  Social History   Socioeconomic History   Marital status: Married    Spouse name: Not on file   Number of children: Not on file   Years of education: Not on file   Highest education level: Not on file  Occupational History   Not on  file  Tobacco Use   Smoking status: Never   Smokeless tobacco: Never  Vaping Use   Vaping Use: Never used  Substance and Sexual Activity   Alcohol use: Yes    Alcohol/week: 2.0 standard drinks of alcohol    Types: 2 Glasses of wine per week    Comment: every evening   Drug use: Never   Sexual activity: Yes  Other Topics Concern   Not on file  Social History Narrative   Not on file   Social Determinants of Health   Financial Resource Strain: Not on file  Food Insecurity: Not on file  Transportation Needs: Not on file  Physical Activity: Not on file  Stress: Not on file  Social Connections: Not on file  Intimate Partner Violence: Not on file    Review of Systems: See HPI, otherwise negative ROS  Physical Exam: There were no vitals taken for this visit. General:   Alert,  pleasant and cooperative in NAD Head:  Normocephalic and atraumatic. Neck:  Supple; no masses or thyromegaly. Lungs:  Clear throughout to auscultation, normal respiratory effort.    Heart:  +S1, +S2, Regular rate and rhythm, No edema. Abdomen:  Soft, nontender and nondistended. Normal bowel sounds, without guarding, and without rebound.   Neurologic:  Alert and  oriented x4;  grossly normal neurologically.  Impression/Plan: Brett Ray is here for an endoscopy  to be performed for  endoscopic placement of video capsule    Risks, benefits, limitations, and alternatives regarding endoscopy have been reviewed with the patient.  Questions have been answered.  All parties agreeable.   Brett Bellows, MD  07/13/2022, 11:42 AM

## 2022-07-13 NOTE — Anesthesia Postprocedure Evaluation (Signed)
Anesthesia Post Note  Patient: Brett Ray  Procedure(s) Performed: GIVENS CAPSULE STUDY COLONOSCOPY ESOPHAGOGASTRODUODENOSCOPY (EGD)  Patient location during evaluation: Endoscopy Anesthesia Type: General Level of consciousness: awake and alert Pain management: pain level controlled Vital Signs Assessment: post-procedure vital signs reviewed and stable Respiratory status: spontaneous breathing, nonlabored ventilation and respiratory function stable Cardiovascular status: blood pressure returned to baseline and stable Postop Assessment: no apparent nausea or vomiting Anesthetic complications: no   No notable events documented.   Last Vitals:  Vitals:   07/13/22 0837 07/13/22 0847  BP: (!) 140/75 (!) 151/77  Pulse: (!) 53 (!) 49  Resp: 12 17  Temp:    SpO2: 98% 97%    Last Pain:  Vitals:   07/13/22 0847  TempSrc:   PainSc: 0-No pain                 [place] Ouch

## 2022-07-13 NOTE — H&P (Signed)
Brett Bellows, MD [address], [place], [place], [place], [address] [address], [place], [place], [place], [address] Phone: [phone]  Fax: [phone]  Primary Care Physician:  Derinda Late, MD   Pre-Procedure History & Physical: HPI:  Brett Ray is a 88 y.o. male is here for an endoscopy and colonoscopy    Past Medical History:  Diagnosis Date   Asymptomatic Sinus bradycardia    Hyperlipidemia    Hypothyroidism    Moderate mitral regurgitation    a. 09/2018 Echo: Mod MR.   Orthostasis    Syncope    a. 09/2018 syncope in the setting of wine/empty stomach/sildenafil/orthostasis; b. 09/2018 Echo: EF 55-60%, no rwma, mild AI, mod MR, mildly dil RV/RA. PASP 67mHg; c. 09/2018 Carotid U/S: <39% bilat ICA dzs.    Past Surgical History:  Procedure Laterality Date   APPENDECTOMY     COLONOSCOPY WITH PROPOFOL N/A 05/19/2022   Procedure: COLONOSCOPY WITH PROPOFOL;  Surgeon: AJonathon Bellows MD;  [facility];  Service: Gastroenterology;  Laterality: N/A;   ESOPHAGOGASTRODUODENOSCOPY (EGD) WITH PROPOFOL N/A 05/19/2022   Procedure: ESOPHAGOGASTRODUODENOSCOPY (EGD) WITH PROPOFOL;  Surgeon: AJonathon Bellows MD;  [facility];  Service: Gastroenterology;  Laterality: N/A;   SHOULDER ARTHROSCOPY     VISCERAL ARTERY INTERVENTION N/A 05/19/2022   Procedure: VISCERAL ARTERY INTERVENTION;  Surgeon: SKatha Cabal MD;  [facility];  Service: Cardiovascular;  Laterality: N/A;    Prior to Admission medications   Medication Sig Start Date End Date Taking? Authorizing Provider  calcium carbonate (TUMS - DOSED IN MG ELEMENTAL CALCIUM) 500 MG chewable tablet Chew 1 tablet by mouth 2 (two) times daily as needed for indigestion or heartburn.   Yes [provider]  citalopram (CELEXA) 10 MG tablet Take 1 tablet by mouth daily. 08/28/18  Yes [provider]  ferrous sulfate 325 (65 FE) MG tablet Take 1 tablet (325 mg total) by mouth  daily with breakfast. 05/23/22 08/21/22 Yes Ghimire, KDante Gang MD  finasteride (PROSCAR) 5 MG tablet Take 5 mg by mouth daily.   Yes [provider]  fluticasone (VERAMYST) 27.5 MCG/SPRAY nasal spray Place 2 sprays into the nose daily.   Yes [provider]  hydrALAZINE (APRESOLINE) 25 MG tablet TAKE 1 TABLET BY MOUTH THREE TIMES DAILY AS NEEDED( PRESSURE> 170) 03/14/22  Yes Gollan, TKathlene November MD  HYDROcodone-acetaminophen (NORCO) 5-325 MG tablet Take 1 tablet by mouth every 6 (six) hours as needed. 06/26/20  Yes [provider]  latanoprost (XALATAN) 0.005 % ophthalmic solution 1 drop at bedtime. 05/19/21  Yes [provider]  levothyroxine (SYNTHROID) 137 MCG tablet Take 137 mcg by mouth daily before breakfast.   Yes [provider]  omeprazole (PRILOSEC) 20 MG capsule Take 1 capsule (20 mg total) by mouth 2 (two) times daily before a meal. 05/22/22 08/20/22 Yes Ghimire, KDante Gang MD  Pseudoephedrine-Acetaminophen (PSEUDOEPHEDRINE-APAP PO) Take 30 mg by mouth in the morning and at bedtime.   Yes [provider]  rosuvastatin (CRESTOR) 20 MG tablet TAKE 1 TABLET(20 MG) BY MOUTH AT BEDTIME 04/27/22  Yes Gollan, TKathlene November MD  chlorpheniramine-HYDROcodone 10-8 MG/5ML Take 5 mLs by mouth every 12 (twelve) hours as needed. Patient not taking: Reported on 06/17/2022    [provider]  levothyroxine (SYNTHROID) 112 MCG tablet Take 137 mcg by mouth daily before breakfast. Patient not taking: Reported on 06/17/2022    [provider]  valACYclovir (VALTREX) 500 MG tablet Take 1 tablet by mouth  3 (three) times daily. Patient not taking: Reported on 06/14/2022    [provider]    Allergies as of 06/14/2022   (No Known Allergies)    No family history on file.  Social History   Socioeconomic History   Marital status: Married    Spouse name: Not on file   Number of children: Not on file   Years of education: Not on file   Highest  education level: Not on file  Occupational History   Not on file  Tobacco Use   Smoking status: Never   Smokeless tobacco: Never  Vaping Use   Vaping Use: Never used  Substance and Sexual Activity   Alcohol use: Yes    Alcohol/week: 2.0 standard drinks of alcohol    Types: 2 Glasses of wine per week    Comment: every evening   Drug use: Never   Sexual activity: Yes  Other Topics Concern   Not on file  Social History Narrative   Not on file   Social Determinants of Health   Financial Resource Strain: Not on file  Food Insecurity: Not on file  Transportation Needs: Not on file  Physical Activity: Not on file  Stress: Not on file  Social Connections: Not on file  Intimate Partner Violence: Not on file    Review of Systems: See HPI, otherwise negative ROS  Physical Exam: BP (!) 126/95   Pulse 62   Temp (!) 96.9 F (36.1 C) (Temporal)   Resp 20   Ht '5\' 9"'$  (1.753 m)   Wt 68.9 kg   SpO2 100%   BMI 22.45 kg/m  General:   Alert,  pleasant and cooperative in NAD Head:  Normocephalic and atraumatic. Neck:  Supple; no masses or thyromegaly. Lungs:  Clear throughout to auscultation, normal respiratory effort.    Heart:  +S1, +S2, Regular rate and rhythm, No edema. Abdomen:  Soft, nontender and nondistended. Normal bowel sounds, without guarding, and without rebound.   Neurologic:  Alert and  oriented x4;  grossly normal neurologically.  Impression/Plan: Brett Ray is here for an capsule , endoscopy and colonoscopy  to be performed for  evaluation of anemia    Risks, benefits, limitations, and alternatives regarding endoscopy have been reviewed with the patient.  Questions have been answered.  All parties agreeable.   Brett Bellows, MD  07/13/2022, 7:47 AM

## 2022-07-13 NOTE — Op Note (Addendum)
[facility] Gastroenterology Patient Name: Brett Ray Procedure Date: 07/13/2022 7:28 AM MRN: [id number] Account #: [id number] Date of Birth: [birth year] Admit Type: Outpatient Age: 88 Room: [facility] ENDO ROOM 3 Gender: Male Note Status: Supervisor Override Instrument Name: Upper Endoscope [id number] Endoscope [id number] Procedure:             Upper GI endoscopy Indications:           Melena Providers:             [name] MD, MD Referring MD:          Caprice Renshaw MD (Referring MD) Medicines:             Monitored Anesthesia Care Complications:         No immediate complications. Procedure:             Pre-Anesthesia Assessment:                        - Prior to the procedure, a History and Physical was                         performed, and patient medications, allergies and                         sensitivities were reviewed. The patient's tolerance                         of previous anesthesia was reviewed.                        - The risks and benefits of the procedure and the                         sedation options and risks were discussed with the                         patient. All questions were answered and informed                         consent was obtained.                        - ASA Grade Assessment: III - A patient with severe                         systemic disease.                        - ASA Grade Assessment: III - A patient with severe                         systemic disease.                        - After reviewing the risks and benefits, the patient                         was deemed in satisfactory condition to undergo the  procedure.                        After obtaining informed consent, the endoscope was                         passed under direct vision. Throughout the procedure,                         the patient's blood pressure, pulse, and oxygen                         saturations were  monitored continuously. The Endoscope                         was introduced through the mouth, with the intention                         of advancing to the duodenum. The scope was advanced                         to the lower third of the esophagus before the                         procedure was aborted. Medications were given. The                         upper GI endoscopy was accomplished with ease. The                         patient tolerated the procedure well. The Endoscope                         was introduced through the mouth, and advanced to the                         lower third of esophagus. The procedure was aborted                         due to the difficulty of the procedure.I was unable to                         navigate the capsule which was attached to the                         endoscope beyond the pharynx , tight angulation and                         felt unsafe to proceed. Findings:      The examined duodenum was normal.      The stomach was normal.      The cardia and gastric fundus were normal on retroflexion.      One benign-appearing, intrinsic mild stenosis was found at the       gastroesophageal junction. This stenosis measured 1.6 cm (inner       diameter). The stenosis was traversed. A TTS dilator was passed through       the scope. Dilation with a 15-16.5-18 mm balloon  dilator was performed       to 18 mm. The dilation site was examined and showed no change.      The esophagus was normal. Impression:            - Normal examined duodenum.                        - Normal stomach.                        - Benign-appearing esophageal stenosis. Dilated.                        - No specimens collected. Recommendation:        - Perform a colonoscopy today.                        - To visualize the small bowel, perform video capsule                         endoscopy today.                        - Discharge patient to home (with escort).                         - Resume previous diet.                        - If has further bleeding retrograde enteroscopy is an                         option . Unsafe to try and place capsule in future                         endoscopically Diagnosis Code(s):     --- Professional ---                        K22.2, Esophageal obstruction [name], MD [name] MD, MD 07/13/2022 8:00:54 AM This report has been signed electronically. Number of Addenda: 0 Note Initiated On: 07/13/2022 7:28 AM Estimated Blood Loss:  Estimated blood loss: none. Estimated blood loss: none.      [facility]

## 2022-07-14 ENCOUNTER — Other Ambulatory Visit: Payer: Self-pay

## 2022-07-14 ENCOUNTER — Encounter: Payer: Self-pay | Admitting: Gastroenterology

## 2022-07-14 DIAGNOSIS — D649 Anemia, unspecified: Secondary | ICD-10-CM

## 2022-07-14 LAB — SURGICAL PATHOLOGY

## 2022-07-14 NOTE — Op Note (Addendum)
[facility] Gastroenterology Patient Name: Brett Ray Procedure Date: 07/13/2022 7:28 AM MRN: [id number] Account #: [id number] Date of Birth: November 15, 1930 Admit Type: Outpatient Age: 88 Room: [facility] ENDO ROOM 3 Gender: Male Note Status: Finalized Instrument Name: Upper Endoscope [id number] Procedure:             Upper GI endoscopycapsule placement Indications:           Obscure gastrointestinal bleeding Providers:             [name] MD, MD Referring MD:          Caprice Renshaw MD (Referring MD) Medicines:             Monitored Anesthesia Care Complications:         No immediate complications. Procedure:             Pre-Anesthesia Assessment:                        - Prior to the procedure, a History and Physical was                         performed, and patient medications, allergies and                         sensitivities were reviewed. The patient's tolerance                         of previous anesthesia was reviewed.                        - The risks and benefits of the procedure and the                         sedation options and risks were discussed with the                         patient. All questions were answered and informed                         consent was obtained.                        - ASA Grade Assessment: III - A patient with severe                         systemic disease.                        - ASA Grade Assessment: III - A patient with severe                         systemic disease.                        - After reviewing the risks and benefits, the patient                         was deemed in satisfactory condition to undergo the  procedure.                        After obtaining informed consent, the endoscope was                         passed under direct vision. Throughout the procedure,                         the patient's blood pressure, pulse, and oxygen                         saturations  were monitored continuously.The procedure                         was performed with moderate difficulty due to                         extrinsic compression. The patient tolerated the                         procedure well. The procedure was aborted due to                         abnormal anatomy. After obtaining informed consent,                         the endoscope was passed under direct vision.                         Throughout the procedure, the patient's blood                         pressure, pulse, and oxygen saturations were monitored                         continuously. Findings:      A moderate-sized area of extrinsic compression was found at the       cricopharyngeus. unable to advance capsule beyond UES- aborted Impression:            - Extrinsic narrowing of the esophagus. Recommendation:        - Discharge patient to home (with escort).                        - Advance diet as tolerated. Procedure Code(s):     --- Professional ---                        [phone], 52, Esophagogastroduodenoscopy, flexible,                         transoral; diagnostic, including collection of                         specimen(s) by brushing or washing, when performed                         (separate procedure) Diagnosis Code(s):     --- Professional ---  K22.2, Esophageal obstruction                        K92.2, Gastrointestinal hemorrhage, unspecified CPT copyright 2019 American Medical Association. All rights reserved. The codes documented in this report are preliminary and upon coder review may  be revised to meet current compliance requirements. [name], MD [name] MD, MD 07/14/2022 9:59:46 AM This report has been signed electronically. Number of Addenda: 0 Note Initiated On: 07/13/2022 7:28 AM Estimated Blood Loss:  Estimated blood loss: none.      [facility]

## 2022-07-15 ENCOUNTER — Encounter: Payer: Self-pay | Admitting: Gastroenterology

## 2022-07-19 ENCOUNTER — Telehealth: Payer: Self-pay

## 2022-07-19 NOTE — Telephone Encounter (Signed)
Patient called just to let you know that he is doing well and that his stools are back to normal and not having any further issues and to let us know that we did a great job taking care of him.

## 2022-08-14 ENCOUNTER — Other Ambulatory Visit: Payer: Self-pay | Admitting: Cardiovascular Disease

## 2022-09-15 ENCOUNTER — Telehealth: Payer: Self-pay

## 2022-09-15 ENCOUNTER — Ambulatory Visit: Payer: Medicare Other | Admitting: Gastroenterology

## 2022-09-15 NOTE — Telephone Encounter (Signed)
LVM for patient informing him that Dr. Vicente Males reviewed his labs on 09/06/2022 and it looks great. He doesn't need to come in unless there are any issues and he is welcome to call or message me anytime if he needs anything.   Thanks,  [place], [place]

## 2023-01-23 ENCOUNTER — Telehealth: Payer: Self-pay | Admitting: Cardiovascular Disease

## 2023-01-23 NOTE — Telephone Encounter (Signed)
Patient requesting a blood pressure wrist cuff. He also wants to know what the correct way to take his BP is it. He would also like to know how often to take it, what times and whether he needs to be sitting or standing.

## 2023-01-23 NOTE — Telephone Encounter (Signed)
Called the patient and educated him on the correct way to take his blood pressure. Nothing further needed.

## 2023-02-21 ENCOUNTER — Other Ambulatory Visit: Payer: Self-pay

## 2023-02-21 ENCOUNTER — Telehealth: Payer: Self-pay | Admitting: Cardiovascular Disease

## 2023-02-21 NOTE — Telephone Encounter (Signed)
Blood pressure reading placed in providers folder for review.

## 2023-02-21 NOTE — Telephone Encounter (Signed)
Received blood pressure reading via [employer], placed in Dr. Windell Hummingbird nurse box.

## 2023-02-22 MED ORDER — LOSARTAN POTASSIUM 25 MG PO TABS
25.0000 mg | ORAL_TABLET | Freq: Every day | ORAL | 3 refills | Status: DC
Start: 2023-02-22 — End: 2023-07-18

## 2023-02-22 NOTE — Telephone Encounter (Signed)
Pt made aware of MD's recommendation and verbalized understanding Losartan 25 mg daily sent to requested pharmacy.  Pt also advised to monitor blood pressure daily and change position slowly.   Antonieta Iba, MD  [facility] Triage1 minute ago (3:07 PM)    Blood pressure numbers are mildly elevated Would recommend we start losartan 25 mg daily Continue hydralazine as needed for systolic pressure 160 or higher Appreciate him taking blood pressure measurements at home Would recommend he continue to monitor blood pressure, Would also periodically  measure blood pressure immediately after standing given history of documented orthostasis Thx TGollan

## 2023-04-24 ENCOUNTER — Encounter: Payer: Self-pay | Admitting: Internal Medicine

## 2023-05-19 ENCOUNTER — Other Ambulatory Visit: Payer: Self-pay | Admitting: Cardiovascular Disease

## 2023-05-19 NOTE — Telephone Encounter (Signed)
Pt scheduled on 9/3

## 2023-05-19 NOTE — Telephone Encounter (Signed)
Good Morning,   Could you please schedule this patient a 12 month follow up visit? Patient was last seen by Dr. Mariah Milling on 06-17-22. Thank you so much.

## 2023-05-24 ENCOUNTER — Ambulatory Visit: Payer: Medicare Other | Admitting: Dermatology

## 2023-05-24 ENCOUNTER — Encounter: Payer: Self-pay | Admitting: Dermatology

## 2023-05-24 VITALS — BP 152/74

## 2023-05-24 DIAGNOSIS — L821 Other seborrheic keratosis: Secondary | ICD-10-CM

## 2023-05-24 DIAGNOSIS — W908XXA Exposure to other nonionizing radiation, initial encounter: Secondary | ICD-10-CM

## 2023-05-24 DIAGNOSIS — L570 Actinic keratosis: Secondary | ICD-10-CM | POA: Diagnosis not present

## 2023-05-24 DIAGNOSIS — D692 Other nonthrombocytopenic purpura: Secondary | ICD-10-CM

## 2023-05-24 DIAGNOSIS — L820 Inflamed seborrheic keratosis: Secondary | ICD-10-CM

## 2023-05-24 DIAGNOSIS — D1801 Hemangioma of skin and subcutaneous tissue: Secondary | ICD-10-CM

## 2023-05-24 DIAGNOSIS — Z1283 Encounter for screening for malignant neoplasm of skin: Secondary | ICD-10-CM | POA: Diagnosis not present

## 2023-05-24 DIAGNOSIS — L814 Other melanin hyperpigmentation: Secondary | ICD-10-CM

## 2023-05-24 DIAGNOSIS — Z872 Personal history of diseases of the skin and subcutaneous tissue: Secondary | ICD-10-CM

## 2023-05-24 DIAGNOSIS — D229 Melanocytic nevi, unspecified: Secondary | ICD-10-CM

## 2023-05-24 DIAGNOSIS — L578 Other skin changes due to chronic exposure to nonionizing radiation: Secondary | ICD-10-CM | POA: Diagnosis not present

## 2023-05-24 NOTE — Progress Notes (Signed)
Follow-Up Visit   Subjective  Brett Ray is a 89 y.o. male who presents for the following: Skin Cancer Screening and Full Body Skin Exam, hx of Aks, checks spots R leg, ~36yr, no symptoms The patient presents for Total-Body Skin Exam (TBSE) for skin cancer screening and mole check. The patient has spots, moles and lesions to be evaluated, some may be new or changing and the patient may have concern these could be cancer.  The following portions of the chart were reviewed this encounter and updated as appropriate: medications, allergies, medical history  Review of Systems:  No other skin or systemic complaints except as noted in HPI or Assessment and Plan.  Objective  Well appearing patient in no apparent distress; mood and affect are within normal limits.  A full examination was performed including scalp, head, eyes, ears, nose, lips, neck, chest, axillae, abdomen, back, buttocks, bilateral upper extremities, bilateral lower extremities, hands, feet, fingers, toes, fingernails, and toenails. All findings within normal limits unless otherwise noted below.   Relevant physical exam findings are noted in the Assessment and Plan.  Scalp, face x 20 (20) Pink scaly macules  face x 6 (6) Stuck on waxy paps with erythema   Assessment & Plan   SKIN CANCER SCREENING PERFORMED TODAY.  ACTINIC DAMAGE - Chronic condition, secondary to cumulative UV/sun exposure - diffuse scaly erythematous macules with underlying dyspigmentation - Recommend daily broad spectrum sunscreen SPF 30+ to sun-exposed areas, reapply every 2 hours as needed.  - Staying in the shade or wearing long sleeves, sun glasses (UVA+UVB protection) and wide brim hats (4-inch brim around the entire circumference of the hat) are also recommended for sun protection.  - Call for new or changing lesions.  LENTIGINES, SEBORRHEIC KERATOSES, HEMANGIOMAS - Benign normal skin lesions - Benign-appearing - Call for any  changes  MELANOCYTIC NEVI - Tan-brown and/or pink-flesh-colored symmetric macules and papules - Benign appearing on exam today - Observation - Call clinic for new or changing moles - Recommend daily use of broad spectrum spf 30+ sunscreen to sun-exposed areas.   Purpura - Chronic; persistent and recurrent.  Treatable, but not curable. - Violaceous macules and patches - Benign - Related to trauma, age, sun damage and/or use of blood thinners, chronic use of topical and/or oral steroids - Observe - Can use OTC arnica containing moisturizer such as Dermend Bruise Formula if desired - Call for worsening or other concerns - legs, arms  AK (actinic keratosis) (20) Scalp, face x 20  Destruction of lesion - Scalp, face x 20 (20) Complexity: simple   Destruction method: cryotherapy   Informed consent: discussed and consent obtained   Timeout:  patient name, date of birth, surgical site, and procedure verified Lesion destroyed using liquid nitrogen: Yes   Region frozen until ice ball extended beyond lesion: Yes   Outcome: patient tolerated procedure well with no complications   Post-procedure details: wound care instructions given    Inflamed seborrheic keratosis (6) face x 6  Symptomatic, irritating, patient would like treated.   Destruction of lesion - face x 6 (6) Complexity: simple   Destruction method: cryotherapy   Informed consent: discussed and consent obtained   Timeout:  patient name, date of birth, surgical site, and procedure verified Lesion destroyed using liquid nitrogen: Yes   Region frozen until ice ball extended beyond lesion: Yes   Outcome: patient tolerated procedure well with no complications   Post-procedure details: wound care instructions given     Return in  1 year (on 05/23/2024) for TBSE, Hx of AKs.  I, Ardis Rowan, RMA, am acting as scribe for Armida Sans, MD .  Documentation: I have reviewed the above documentation for accuracy and completeness,  and I agree with the above.  Armida Sans, MD

## 2023-05-24 NOTE — Patient Instructions (Addendum)
Cryotherapy Aftercare  Wash gently with soap and water everyday.   Apply Vaseline and Band-Aid daily until healed.     Due to recent changes in healthcare laws, you may see results of your pathology and/or laboratory studies on MyChart before the doctors have had a chance to review them. We understand that in some cases there may be results that are confusing or concerning to you. Please understand that not all results are received at the same time and often the doctors may need to interpret multiple results in order to provide you with the best plan of care or course of treatment. Therefore, we ask that you please give us 2 business days to thoroughly review all your results before contacting the office for clarification. Should we see a critical lab result, you will be contacted sooner.   If You Need Anything After Your Visit  If you have any questions or concerns for your doctor, please call our main line at [phone] and press option 4 to reach your doctor's medical assistant. If no one answers, please leave a voicemail as directed and we will return your call as soon as possible. Messages left after 4 pm will be answered the following business day.   You may also send us a message via MyChart. We typically respond to MyChart messages within 1-2 business days.  For prescription refills, please ask your pharmacy to contact our office. Our fax number is [phone].  If you have an urgent issue when the clinic is closed that cannot wait until the next business day, you can page your doctor at the number below.    Please note that while we do our best to be available for urgent issues outside of office hours, we are not available 24/7.   If you have an urgent issue and are unable to reach us, you may choose to seek medical care at your doctor's office, retail clinic, urgent care center, or emergency room.  If you have a medical emergency, please immediately call 911 or go to the  emergency department.  Pager Numbers  - Dr. Kowalski: [phone]  - Dr. Moye: [phone]  - Dr. Stewart: [phone]  In the event of inclement weather, please call our main line at [phone] for an update on the status of any delays or closures.  Dermatology Medication Tips: Please keep the boxes that topical medications come in in order to help keep track of the instructions about where and how to use these. Pharmacies typically print the medication instructions only on the boxes and not directly on the medication tubes.   If your medication is too expensive, please contact our office at [phone] option 4 or send us a message through MyChart.   We are unable to tell what your co-pay for medications will be in advance as this is different depending on your insurance coverage. However, we may be able to find a substitute medication at lower cost or fill out paperwork to get insurance to cover a needed medication.   If a prior authorization is required to get your medication covered by your insurance company, please allow us 1-2 business days to complete this process.  Drug prices often vary depending on where the prescription is filled and some pharmacies may offer cheaper prices.  The website www.goodrx.com contains coupons for medications through different pharmacies. The prices here do not account for what the cost may be with help from insurance (it may be cheaper with your insurance), but the website can   give you the price if you did not use any insurance.  - You can print the associated coupon and take it with your prescription to the pharmacy.  - You may also stop by our office during regular business hours and pick up a GoodRx coupon card.  - If you need your prescription sent electronically to a different pharmacy, notify our office through [facility] MyChart or by phone at [phone] option 4.     Si Usted Necesita Algo Despus de Su Visita  Tambin puede  enviarnos un mensaje a travs de MyChart. Por lo general respondemos a los mensajes de MyChart en el transcurso de 1 a 2 das hbiles.  Para renovar recetas, por favor pida a su farmacia que se ponga en contacto con nuestra oficina. Nuestro nmero de fax es el [phone].  Si tiene un asunto urgente cuando la clnica est cerrada y que no puede esperar hasta el siguiente da hbil, puede llamar/localizar a su doctor(a) al nmero que aparece a continuacin.   Por favor, tenga en cuenta que aunque hacemos todo lo posible para estar disponibles para asuntos urgentes fuera del horario de oficina, no estamos disponibles las 24 horas del da, los 7 das de la semana.   Si tiene un problema urgente y no puede comunicarse con nosotros, puede optar por buscar atencin mdica  en el consultorio de su doctor(a), en una clnica privada, en un centro de atencin urgente o en una sala de emergencias.  Si tiene una emergencia mdica, por favor llame inmediatamente al 911 o vaya a la sala de emergencias.  Nmeros de bper  - Dr. Kowalski: [phone]  - Dra. Moye: [phone]  - Dra. Stewart: [phone]  En caso de inclemencias del tiempo, por favor llame a nuestra lnea principal al [phone] para una actualizacin sobre el estado de cualquier retraso o cierre.  Consejos para la medicacin en dermatologa: Por favor, guarde las cajas en las que vienen los medicamentos de uso tpico para ayudarle a seguir las instrucciones sobre dnde y cmo usarlos. Las farmacias generalmente imprimen las instrucciones del medicamento slo en las cajas y no directamente en los tubos del medicamento.   Si su medicamento es muy caro, por favor, pngase en contacto con nuestra oficina llamando al [phone] y presione la opcin 4 o envenos un mensaje a travs de MyChart.   No podemos decirle cul ser su copago por los medicamentos por adelantado ya que esto es diferente dependiendo de la cobertura de su seguro.  Sin embargo, es posible que podamos encontrar un medicamento sustituto a menor costo o llenar un formulario para que el seguro cubra el medicamento que se considera necesario.   Si se requiere una autorizacin previa para que su compaa de seguros cubra su medicamento, por favor permtanos de 1 a 2 das hbiles para completar este proceso.  Los precios de los medicamentos varan con frecuencia dependiendo del lugar de dnde se surte la receta y alguna farmacias pueden ofrecer precios ms baratos.  El sitio web www.goodrx.com tiene cupones para medicamentos de diferentes farmacias. Los precios aqu no tienen en cuenta lo que podra costar con la ayuda del seguro (puede ser ms barato con su seguro), pero el sitio web puede darle el precio si no utiliz ningn seguro.  - Puede imprimir el cupn correspondiente y llevarlo con su receta a la farmacia.  - Tambin puede pasar por nuestra oficina durante el horario de atencin regular y recoger una tarjeta de cupones de GoodRx.  -   Si necesita que su receta se enve electrnicamente a una farmacia diferente, informe a nuestra oficina a travs de MyChart de [facility] o por telfono llamando al [phone] y presione la opcin 4.  

## 2023-05-26 ENCOUNTER — Encounter: Payer: Self-pay | Admitting: Dermatology

## 2023-07-17 NOTE — Progress Notes (Unsigned)
Date:  07/18/2023   ID:  Brett Ray, DOB 04/04/1931, MRN [id number]  Patient Location:  [address] [place] [address]   Provider location:   [facility], [place] office  PCP:  Kandyce Rud, MD  Cardiologist:  Fonnie Mu  Chief Complaint  Patient presents with   12 month follow up     "Doing well." Medications reviewed by the patient verbally.     History of Present Illness:    Brett Ray is a 89 y.o. male  past medical history of Hypokalemia Depression Hypothyroid Hx of syncope after flomax, viagra, ETOH  09/2018 Chronic asymptomatic bradycardia Long hx of rare orthostasis calcium score 900 in 05/2019 Chronic dizziness Presents for f/u of his syncope, orthostasis, coronary calcification  Last seen in clinic August 2023 BP labile, up and down Chronic gait instability, needs to steady himself  Chronic bradycardia  Drinks plenty of water Slow trend down in weight over the past several years  Blood pressure low today 100 systolic rates in the 50s  Active at baseline, denies chest pain concerning for angina Minimal ankle swelling Does not want compression hose  Denies any GI bleeding  Reports that he continues to do his 1 hour of exercise every morning No side effects to the Crestor  Lab work reviewed from October 2023 Reports he will have repeat lab work done with primary care in the near future  EKG personally reviewed by myself on todays visit EKG Interpretation Date/Time:  Tuesday July 18 2023 08:59:02 EDT Ventricular Rate:  56 PR Interval:  216 QRS Duration:  128 QT Interval:  466 QTC Calculation: 449 R Axis:   54  Text Interpretation: Sinus bradycardia with 1st degree A-V block Right bundle branch block When compared with ECG of 17-May-2022 06:44, No significant change was found Confirmed by Julien Nordmann [id number]) on 07/18/2023 9:07:09 AM   Other past medical history reviewed hospital early July  2023 diarrhea, dark stool, GI bleed Testing as below CTA of the abdomen pelvis negative for acute bleeding.   EGD and colonoscopy with no source of bleeding, bleeding from the small intestine. Repeat CT angiogram with bleeding from hepatic flexure area Selective angiogram and coil embolization SMA branch by vascular surgery with clinical improvement. Hemoglobin down to 6.9 -Received total 5 units of PRBC.  Placed on iron at discharge May 22, 2022  CT coronary calcium scoreof 907.  This was 61% percentile for age and sex matched control.  Mildly dilated ascending aorta   hospital November 2019 for syncope Felt flush after drinking several glasses of wine on empty stomach With sitting, tried to get up to go to the bathroom had acute syncope EMS called BP-91/56 initially,   Sat him up in a chair had syncope again He reported having Viagra 40 mg prior to the party, was also on Flomax Bradycardia rate 48 Reports having chronic bradycardia, typically is asymptomatic   Echocardiogram November 2019 Left ventricle: The cavity size was mildly dilated. Wall   thickness was normal. Systolic function was normal. The estimated   ejection fraction was in the range of 55% to 60%. Wall motion was   normal; there were no regional wall motion abnormalities. Left   ventricular diastolic function parameters were normal. - Aortic valve: There was mild regurgitation. - Mitral valve: There was moderate regurgitation. - Right ventricle: The cavity size was mildly dilated. Wall   thickness was normal. - Right atrium: The atrium was mildly dilated. -  Pulmonary arteries: Systolic pressure was mildly to moderately   increased. PA peak pressure: 45 mm Hg (S).     Past Medical History:  Diagnosis Date   Actinic keratosis    Asymptomatic Sinus bradycardia    Hyperlipidemia    Hypothyroidism    Moderate mitral regurgitation    a. 09/2018 Echo: Mod MR.   Orthostasis    Syncope    a. 09/2018 syncope in  the setting of wine/empty stomach/sildenafil/orthostasis; b. 09/2018 Echo: EF 55-60%, no rwma, mild AI, mod MR, mildly dil RV/RA. PASP ; c. 09/2018 Carotid U/S: <39% bilat ICA dzs.   Past Surgical History:  Procedure Laterality Date   APPENDECTOMY     COLONOSCOPY N/A 07/13/2022   Procedure: COLONOSCOPY;  Surgeon: Wyline Mood, MD;  Location: [facility] ENDOSCOPY;  Service: Gastroenterology;  Laterality: N/A;   COLONOSCOPY WITH PROPOFOL N/A 05/19/2022   Procedure: COLONOSCOPY WITH PROPOFOL;  Surgeon: Wyline Mood, MD;  Location: [facility] ENDOSCOPY;  Service: Gastroenterology;  Laterality: N/A;   ESOPHAGOGASTRODUODENOSCOPY N/A 07/13/2022   Procedure: ESOPHAGOGASTRODUODENOSCOPY (EGD);  Surgeon: Wyline Mood, MD;  Location: [facility] ENDOSCOPY;  Service: Gastroenterology;  Laterality: N/A;   ESOPHAGOGASTRODUODENOSCOPY N/A 07/13/2022   Procedure: ESOPHAGOGASTRODUODENOSCOPY (EGD);  Surgeon: Wyline Mood, MD;  Location: [facility] ENDOSCOPY;  Service: Gastroenterology;  Laterality: N/A;   ESOPHAGOGASTRODUODENOSCOPY (EGD) WITH PROPOFOL N/A 05/19/2022   Procedure: ESOPHAGOGASTRODUODENOSCOPY (EGD) WITH PROPOFOL;  Surgeon: Wyline Mood, MD;  Location: [facility] ENDOSCOPY;  Service: Gastroenterology;  Laterality: N/A;   GIVENS CAPSULE STUDY N/A 07/13/2022   Procedure: GIVENS CAPSULE STUDY;  Surgeon: Wyline Mood, MD;  Location: [facility] ENDOSCOPY;  Service: Gastroenterology;  Laterality: N/A;   SHOULDER ARTHROSCOPY     VISCERAL ARTERY INTERVENTION N/A 05/19/2022   Procedure: VISCERAL ARTERY INTERVENTION;  Surgeon: Renford Dills, MD;  Location: [facility] INVASIVE CV LAB;  Service: Cardiovascular;  Laterality: N/A;     Current Meds  Medication Sig   calcium carbonate (TUMS - DOSED IN MG ELEMENTAL CALCIUM) 500 MG chewable tablet Chew 1 tablet by mouth 2 (two) times daily as needed for indigestion or heartburn.   citalopram (CELEXA) 10 MG tablet Take 1 tablet by mouth daily.   ferrous sulfate 325 (65 FE) MG tablet Take 1 tablet (325 mg total) by  mouth daily with breakfast.   finasteride (PROSCAR) 5 MG tablet Take 5 mg by mouth daily.   fluticasone (VERAMYST) 27.5 MCG/SPRAY nasal spray Place 2 sprays into the nose daily.   HYDROcodone-acetaminophen (NORCO) 5-325 MG tablet Take 1 tablet by mouth every 6 (six) hours as needed.   latanoprost (XALATAN) 0.005 % ophthalmic solution 1 drop at bedtime.   levothyroxine (SYNTHROID) 137 MCG tablet Take 137 mcg by mouth daily before breakfast.   methocarbamol (ROBAXIN) 500 MG tablet Take 500 mg by mouth 2 (two) times daily.   omeprazole (PRILOSEC) 20 MG capsule Take 1 capsule (20 mg total) by mouth 2 (two) times daily before a meal.   Pseudoephedrine-Acetaminophen (PSEUDOEPHEDRINE-APAP PO) Take 30 mg by mouth in the morning and at bedtime.   rosuvastatin (CRESTOR) 20 MG tablet TAKE 1 TABLET(20 MG) BY MOUTH AT BEDTIME     Allergies:   Patient has no known allergies.   Social History   Tobacco Use   Smoking status: Never   Smokeless tobacco: Never  Vaping Use   Vaping status: Never Used  Substance Use Topics   Alcohol use: Yes    Alcohol/week: 2.0 standard drinks of alcohol    Types: 2 Glasses of wine per week  Comment: every evening   Drug use: Never     Family Hx: The patient's family history is not on file.  ROS:   Please see the history of present illness.    Review of Systems  Constitutional: Negative.   HENT: Negative.    Respiratory: Negative.    Cardiovascular: Negative.   Gastrointestinal: Negative.   Musculoskeletal: Negative.   Neurological: Negative.   Psychiatric/Behavioral: Negative.    All other systems reviewed and are negative.    Labs/Other Tests and Data Reviewed:    Recent Labs: No results found for requested labs within last 365 days.   Recent Lipid Panel No results found for: "CHOL", "TRIG", "HDL", "CHOLHDL", "LDLCALC", "LDLDIRECT"   Exam:    Vital Signs: Vital signs may also be detailed in the HPI BP (!) 100/50 (BP Location: Left Arm,  Patient Position: Sitting, Cuff Size: Normal)   Pulse (!) 56   Ht 5\' 9"  (1.753 m)   Wt 157 lb 8 oz (71.4 kg)   SpO2 95%   BMI 23.26 kg/m   Constitutional:  oriented to person, place, and time. No distress.  HENT:  Head: Grossly normal Eyes:  no discharge. No scleral icterus.  Neck: No JVD, no carotid bruits  Cardiovascular: Regular rate and rhythm, no murmurs appreciated Pulmonary/Chest: Clear to auscultation bilaterally, no wheezes or rails Abdominal: Soft.  no distension.  no tenderness.  Musculoskeletal: Normal range of motion Neurological:  normal muscle tone. Coordination normal. 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 had remote stroke affecting his balance Not on blood pressure medication  Labile blood pressure Low on today's visit, reports other times it is high Stays hydrated Will not treat aggressively given his balance issues and prior orthostasis symptoms and history of syncope  Bradycardia -  Asymptomatic, rates stable in the 50s  Syncope, unspecified syncope type -  No recent episodes,  Not on blood pressure medication   Total encounter time more than 30 minutes  Greater than 50% was spent in counseling and coordination of care with the patient   Signed, Julien Nordmann, MD  07/18/2023 9:01 AM    [facility] [address], [place], [place] [address]

## 2023-07-18 ENCOUNTER — Ambulatory Visit: Payer: Medicare Other | Attending: Cardiovascular Disease | Admitting: Cardiovascular Disease

## 2023-07-18 ENCOUNTER — Encounter: Payer: Self-pay | Admitting: Cardiovascular Disease

## 2023-07-18 VITALS — BP 100/50 | HR 56 | Ht 69.0 in | Wt 157.5 lb

## 2023-07-18 DIAGNOSIS — I10 Essential (primary) hypertension: Secondary | ICD-10-CM

## 2023-07-18 DIAGNOSIS — I25118 Atherosclerotic heart disease of native coronary artery with other forms of angina pectoris: Secondary | ICD-10-CM

## 2023-07-18 DIAGNOSIS — E782 Mixed hyperlipidemia: Secondary | ICD-10-CM

## 2023-07-18 DIAGNOSIS — R55 Syncope and collapse: Secondary | ICD-10-CM

## 2023-07-18 DIAGNOSIS — E7800 Pure hypercholesterolemia, unspecified: Secondary | ICD-10-CM | POA: Diagnosis not present

## 2023-07-18 MED ORDER — ROSUVASTATIN CALCIUM 20 MG PO TABS: 20.0000 mg | ORAL_TABLET | Freq: Every day | ORAL | 3 refills | Status: DC

## 2023-07-18 NOTE — Patient Instructions (Signed)
Medication Instructions:  No changes  If cholesterol runs high, >150 We can add zetia 10 mg daily  If you need a refill on your cardiac medications before your next appointment, please call your pharmacy.   Lab work: No new labs needed  Testing/Procedures: No new testing needed  Follow-Up: At [facility], you and your health needs are our priority.  As part of our continuing mission to provide you with exceptional heart care, we have created designated Provider Care Teams.  These Care Teams include your primary Cardiologist (physician) and Advanced Practice Providers (APPs -  Physician Assistants and Nurse Practitioners) who all work together to provide you with the care you need, when you need it.  You will need a follow up appointment in 12 months  Providers on your designated Care Team:   Nicolasa Ducking, NP Eula Listen, PA-C Cadence Fransico Michael, [place]  COVID-19 Vaccine Information can be found at: [email or web address] For questions related to vaccine distribution or appointments, please email [email or web address] or call [phone].

## 2024-02-17 ENCOUNTER — Other Ambulatory Visit: Payer: Self-pay | Admitting: Cardiovascular Disease

## 2024-05-23 ENCOUNTER — Ambulatory Visit: Payer: Medicare Other | Admitting: Dermatology

## 2024-06-04 ENCOUNTER — Ambulatory Visit: Admitting: Dermatology

## 2024-06-04 ENCOUNTER — Encounter: Payer: Self-pay | Admitting: Dermatology

## 2024-06-04 DIAGNOSIS — D1801 Hemangioma of skin and subcutaneous tissue: Secondary | ICD-10-CM

## 2024-06-04 DIAGNOSIS — L820 Inflamed seborrheic keratosis: Secondary | ICD-10-CM | POA: Diagnosis not present

## 2024-06-04 DIAGNOSIS — D229 Melanocytic nevi, unspecified: Secondary | ICD-10-CM

## 2024-06-04 DIAGNOSIS — Z1283 Encounter for screening for malignant neoplasm of skin: Secondary | ICD-10-CM | POA: Diagnosis not present

## 2024-06-04 DIAGNOSIS — S41112A Laceration without foreign body of left upper arm, initial encounter: Secondary | ICD-10-CM | POA: Diagnosis not present

## 2024-06-04 DIAGNOSIS — L814 Other melanin hyperpigmentation: Secondary | ICD-10-CM | POA: Diagnosis not present

## 2024-06-04 DIAGNOSIS — L578 Other skin changes due to chronic exposure to nonionizing radiation: Secondary | ICD-10-CM

## 2024-06-04 DIAGNOSIS — S51012A Laceration without foreign body of left elbow, initial encounter: Secondary | ICD-10-CM

## 2024-06-04 DIAGNOSIS — L821 Other seborrheic keratosis: Secondary | ICD-10-CM

## 2024-06-04 DIAGNOSIS — W908XXA Exposure to other nonionizing radiation, initial encounter: Secondary | ICD-10-CM

## 2024-06-04 DIAGNOSIS — D692 Other nonthrombocytopenic purpura: Secondary | ICD-10-CM

## 2024-06-04 DIAGNOSIS — L570 Actinic keratosis: Secondary | ICD-10-CM

## 2024-06-04 MED ORDER — MUPIROCIN 2 % EX OINT: 1.0000 | TOPICAL_OINTMENT | Freq: Every day | CUTANEOUS | 2 refills | Status: AC

## 2024-06-04 NOTE — Patient Instructions (Addendum)

## 2024-06-04 NOTE — Progress Notes (Unsigned)
 Follow-Up Visit   Subjective  Brett Ray is a 89 y.o. male who presents for the following: Skin Cancer Screening and Full Body Skin Exam hx of Aks, check spot R medial ankle   The patient presents for Total-Body Skin Exam (TBSE) for skin cancer screening and mole check. The patient has spots, moles and lesions to be evaluated, some may be new or changing and the patient may have concern these could be cancer.  The following portions of the chart were reviewed this encounter and updated as appropriate: medications, allergies, medical history  Review of Systems:  No other skin or systemic complaints except as noted in HPI or Assessment and Plan.  Objective  Well appearing patient in no apparent distress; mood and affect are within normal limits.  A full examination was performed including scalp, head, eyes, ears, nose, lips, neck, chest, axillae, abdomen, back, buttocks, bilateral upper extremities, bilateral lower extremities, hands, feet, fingers, toes, fingernails, and toenails. All findings within normal limits unless otherwise noted below.   Relevant physical exam findings are noted in the Assessment and Plan.  Scalp, ears, face x 16 (16) Pink scaly macules R medial ankle x 1 Stuck on waxy pap with erythema 1.2 x 1.5cm   Assessment & Plan   SKIN CANCER SCREENING PERFORMED TODAY.  ACTINIC DAMAGE - Chronic condition, secondary to cumulative UV/sun exposure - diffuse scaly erythematous macules with underlying dyspigmentation - Recommend daily broad spectrum sunscreen SPF 30+ to sun-exposed areas, reapply every 2 hours as needed.  - Staying in the shade or wearing long sleeves, sun glasses (UVA+UVB protection) and wide brim hats (4-inch brim around the entire circumference of the hat) are also recommended for sun protection.  - Call for new or changing lesions.  LENTIGINES, SEBORRHEIC KERATOSES, HEMANGIOMAS - Benign normal skin lesions - Benign-appearing - Call for any  changes  MELANOCYTIC NEVI - Tan-brown and/or pink-flesh-colored symmetric macules and papules - Benign appearing on exam today - Observation - Call clinic for new or changing moles - Recommend daily use of broad spectrum spf 30+ sunscreen to sun-exposed areas.   Purpura - Chronic; persistent and recurrent.  Treatable, but not curable. - Violaceous macules and patches - Benign - Related to trauma, age, sun damage and/or use of blood thinners, chronic use of topical and/or oral steroids - Observe - Can use OTC arnica containing moisturizer such as Dermend Bruise Formula if desired - Call for worsening or other concerns  WOUND = skin tear = laceration L arm Exam: Wound L arm.  No infection today. Treatment Plan: Start Mupirocin  oint qd to open sores/wounds   AK (ACTINIC KERATOSIS) (16) Scalp, ears, face x 16 (16) Actinic keratoses are precancerous spots that appear secondary to cumulative UV radiation exposure/sun exposure over time. They are chronic with expected duration over 1 year. A portion of actinic keratoses will progress to squamous cell carcinoma of the skin. It is not possible to reliably predict which spots will progress to skin cancer and so treatment is recommended to prevent development of skin cancer.  Recommend daily broad spectrum sunscreen SPF 30+ to sun-exposed areas, reapply every 2 hours as needed.  Recommend staying in the shade or wearing long sleeves, sun glasses (UVA+UVB protection) and wide brim hats (4-inch brim around the entire circumference of the hat). Call for new or changing lesions. Destruction of lesion - Scalp, ears, face x 16 (16) Complexity: simple   Destruction method: cryotherapy   Informed consent: discussed and consent obtained  Timeout:  patient name, date of birth, surgical site, and procedure verified Lesion destroyed using liquid nitrogen: Yes   Region frozen until ice ball extended beyond lesion: Yes   Outcome: patient tolerated  procedure well with no complications   Post-procedure details: wound care instructions given    INFLAMED SEBORRHEIC KERATOSIS R medial ankle x 1 Symptomatic, irritating, patient would like treated.  Recheck on f/u, may consider bx if not improving Destruction of lesion - R medial ankle x 1 Complexity: simple   Destruction method: cryotherapy   Informed consent: discussed and consent obtained   Timeout:  patient name, date of birth, surgical site, and procedure verified Lesion destroyed using liquid nitrogen: Yes   Region frozen until ice ball extended beyond lesion: Yes   Outcome: patient tolerated procedure well with no complications   Post-procedure details: wound care instructions given    LACERATION OF SKIN OF ELBOW, LEFT, INITIAL ENCOUNTER   Return in about 3 months (around 09/04/2024) for Recheck ISK R medial ankle, AKs scalp.  I, Grayce Saunas, RMA, am acting as scribe for Alm Rhyme, MD .   Documentation: I have reviewed the above documentation for accuracy and completeness, and I agree with the above.  Alm Rhyme, MD

## 2024-06-05 ENCOUNTER — Encounter: Payer: Self-pay | Admitting: Dermatology

## 2024-07-16 ENCOUNTER — Telehealth: Payer: Self-pay | Admitting: Cardiovascular Disease

## 2024-07-16 NOTE — Telephone Encounter (Signed)
 Returned pt's call.  He was wanting to know what is considered a good BP for pt's he and his wife's age (low 57's).  I explained that from a cardiac standpoint, BP from 100/60s - 150/80s is considered a good range.  Pt states that he and his wife are doing good but wanted a reference because family members were concerned with a couple of his wife's readings. Pt thanked me for the information and return call.

## 2024-07-16 NOTE — Telephone Encounter (Signed)
 Pt would like to know what's a normal BP for someone his age.

## 2024-08-22 ENCOUNTER — Other Ambulatory Visit: Payer: Self-pay | Admitting: Cardiovascular Disease

## 2024-09-03 ENCOUNTER — Ambulatory Visit: Admitting: Dermatology

## 2024-11-21 ENCOUNTER — Encounter: Payer: Self-pay | Admitting: Cardiology

## 2024-11-21 ENCOUNTER — Ambulatory Visit: Attending: Cardiology | Admitting: Cardiology

## 2024-11-21 VITALS — BP 154/75 | HR 54 | Ht 69.0 in | Wt 158.6 lb

## 2024-11-21 DIAGNOSIS — R600 Localized edema: Secondary | ICD-10-CM

## 2024-11-21 DIAGNOSIS — R0989 Other specified symptoms and signs involving the circulatory and respiratory systems: Secondary | ICD-10-CM

## 2024-11-21 DIAGNOSIS — I2510 Atherosclerotic heart disease of native coronary artery without angina pectoris: Secondary | ICD-10-CM

## 2024-11-21 DIAGNOSIS — R001 Bradycardia, unspecified: Secondary | ICD-10-CM

## 2024-11-21 DIAGNOSIS — E782 Mixed hyperlipidemia: Secondary | ICD-10-CM

## 2024-11-21 DIAGNOSIS — Z87898 Personal history of other specified conditions: Secondary | ICD-10-CM

## 2024-11-21 NOTE — Patient Instructions (Signed)
 Medication Instructions:   Your physician recommends that you continue on your current medications as directed. Please refer to the Current Medication list given to you today.   *If you need a refill on your cardiac medications before your next appointment, please call your pharmacy*  Lab Work: No labs ordered today  If you have labs (blood work) drawn today and your tests are completely normal, you will receive your results only by: MyChart Message (if you have MyChart) OR A paper copy in the mail If you have any lab test that is abnormal or we need to change your treatment, we will call you to review the results.  Testing/Procedures: No test ordered today   Follow-Up: At [facility], you and your health needs are our priority.  As part of our continuing mission to provide you with exceptional heart care, our providers are all part of one team.  This team includes your primary Cardiologist (physician) and Advanced Practice Providers or APPs (Physician Assistants and Nurse Practitioners) who all work together to provide you with the care you need, when you need it.  Your next appointment:   12 month(s)  Provider:   You may see Timothy Gollan, MD or one of the following Advanced Practice Providers on your designated Care Team:   Lonni Meager, NP Lesley Maffucci, PA-C Bernardino Bring, PA-C Cadence Benton, PA-C Tylene Lunch, NP Barnie Hila, NP    We recommend signing up for the patient portal called MyChart.  Sign up information is provided on this After Visit Summary.  MyChart is used to connect with patients for Virtual Visits (Telemedicine).  Patients are able to view lab/test results, encounter notes, upcoming appointments, etc.  Non-urgent messages can be sent to your provider as well.   To learn more about what you can do with MyChart, go to [email or web address].

## 2024-11-21 NOTE — Progress Notes (Signed)
 " Cardiology Office Note   Date:  11/21/2024  ID:  Brett Ray, DOB [birth year], MRN [id number] PCP: Diedra Lame, MD  [facility] Providers Cardiologist:  Evalene Lunger, MD     History of Present Illness Brett Ray is a 89 y.o. male with a past medical history of hypothyroidism, history of syncope after Flomax, Viagra, and EtOH (09/2018), chronic asymptomatic bradycardia, long history of orthostasis, elevated coronary calcium  score of 900 (05/2019), chronic dizziness, depression, hypothyroidism, and a history of hypokalemia, who is here today for follow-up.   Previously underwent echocardiogram in 2019 which revealed an LVEF 55 to 60%, no wall motion abnormalities, mild aortic regurgitation, moderate mitral regurgitation.  In 2020 had calcium  scoring completed which revealed a calcium  score of 907.  It was considered a 85 1st percentile for age and sex matched control.  He was last seen in clinic 08/04/2023 by Dr. Gollan.  Reported he continued to do exercise for approximately 1 hour/day every morning.  Denied any recurrent bleeding.  Denied any chest pain concerning for angina.  Had minimal ankle swelling.  But did not want to use compression stockings.  He was continued on his current medication regimen and no further testing was ordered.   He returns to clinic today stating for the cardiac perspective he has been doing well.  He states that he is having worsening issues with his memory.  He is caring for his wife who has end-stage Alzheimer's.  He continues to reside at the [facility].  States that he has been compliant with his current medication regimen.  Denies any chest pain, shortness of breath, dyspnea on exertion.  Does endorse occasional peripheral edema.  Denies any recent hospitalizations or visits to the emergency department.  ROS: 10 point review of system has been reviewed and considered negative the exception was been listed in the HPI  Studies Reviewed EKG  Interpretation Date/Time:  Thursday November 21 2024 15:08:03 EST Ventricular Rate:  54 PR Interval:  232 QRS Duration:  132 QT Interval:  478 QTC Calculation: 453 R Axis:   25  Text Interpretation: Sinus bradycardia with 1st degree A-V block Right bundle branch block Minimal voltage criteria for LVH, may be normal variant ( Sokolow-Lyon ) When compared with ECG of 18-Jul-2023 08:59, No significant change was found Confirmed by Gerard Frederick ([id number]) on 11/21/2024 3:14:14 PM    Calcium  Scoring 05/31/2019 IMPRESSION: Coronary calcium  score of 907. This was 61% percentile for age and sex matched control.   Recommend Chest CT angiogram to further assess dilated aorta at the sinuses of Valsalva.  2d echo 09/23/2018 - Left ventricle: The cavity size was mildly dilated. Wall    thickness was normal. Systolic function was normal. The estimated    ejection fraction was in the range of 55% to 60%. Wall motion was    normal; there were no regional wall motion abnormalities. Left    ventricular diastolic function parameters were normal.  - Aortic valve: There was mild regurgitation.  - Mitral valve: There was moderate regurgitation.  - Right ventricle: The cavity size was mildly dilated. Wall    thickness was normal.  - Right atrium: The atrium was mildly dilated.  - Pulmonary arteries: Systolic pressure was mildly to moderately    increased. PA peak pressure: 45 mm Hg (S).   Risk Assessment/Calculations   HYPERTENSION CONTROL Vitals:   11/21/24 1456 11/21/24 1518  BP: (!) 161/75 (!) 154/75    The patient's blood pressure is elevated  above target today.  In order to address the patient's elevated BP: Blood pressure will be monitored at home to determine if medication changes need to be made. (Has as needed medications for blood pressure greater than 170 systolic)          Physical Exam VS:  BP (!) 154/75 (BP Location: Left Arm, Patient Position: Sitting, Cuff Size: Normal)   Pulse  (!) 54   Ht 5' 9 (1.753 m)   Wt 158 lb 9.6 oz (71.9 kg)   SpO2 95%   BMI 23.42 kg/m        Wt Readings from Last 3 Encounters:  11/21/24 158 lb 9.6 oz (71.9 kg)  07/18/23 157 lb 8 oz (71.4 kg)  07/13/22 152 lb (68.9 kg)    GEN: Well nourished, well developed in no acute distress NECK: No JVD; No carotid bruits CARDIAC: RRR, bradycardic, no murmurs, rubs, gallops RESPIRATORY:  Clear to auscultation without rales, wheezing or rhonchi  ABDOMEN: Soft, non-tender, non-distended EXTREMITIES: Trace pretibial edema; CEAP class III varicose veins, no deformity   ASSESSMENT AND PLAN Coronary artery calcification seen on CT scan in 2020 with a calcium  score of 907.  He denies any symptoms of angina or decompensation.  He has been maintained on rosuvastatin .  EKG today reveals sinus bradycardia with first-degree AV block with a rate of 54 and LVH with no acute ischemic change from prior study.  No further ischemic evaluation needed at this time.  Labile blood pressure that is chronic.  He has hydralazine  25 mg that he can take up to 3 times a day for systolic blood pressure greater than 170.  He has been encouraged to periodically take his pressures at home as well.  Chronic dizziness with previous history of syncope.  At this relatively well-controlled symptoms a steady cyst self.  Feels like the remote stroke that he had several was back as 1 affected his balance.  He has had no reoccurrence of syncope and is not currently on daily blood pressure medication.  Asymptomatic bradycardia with sinus pericardia with a rate of 54 noted on EKG today.  He is not on any AV nodal blocking agents.  He is chronotropically appropriate.  Will continue to monitor with surveillance studies.  Hyperlipidemia with an LDL of 105.  He is continued on rosuvastatin  20 mg daily.  Ongoing management per his PCP.  Previously discussed adding ezetimibe 10 mg daily to his rosuvastatin .  Patient declines at this  time.  Peripheral edema to the bilateral lower extremities likely component of venous insufficiency as he does have class III varicose veins.  He has been encouraged to participate in conservative therapy of elevating his extremities, foot calf pumps, decrease in his sodium intake, knee-high compression stockings.       Dispo: Patient to return to clinic see MD/APP in 11 to 12 months or sooner if needed for further evaluation.  Signed, Jayce Boyko, NP   "

## 2024-11-25 ENCOUNTER — Other Ambulatory Visit: Payer: Self-pay | Admitting: Cardiovascular Disease

## 2024-12-03 ENCOUNTER — Ambulatory Visit: Admitting: Dermatology

## 2024-12-03 ENCOUNTER — Encounter: Payer: Self-pay | Admitting: Dermatology

## 2024-12-03 DIAGNOSIS — R21 Rash and other nonspecific skin eruption: Secondary | ICD-10-CM

## 2024-12-03 DIAGNOSIS — Z1283 Encounter for screening for malignant neoplasm of skin: Secondary | ICD-10-CM | POA: Diagnosis not present

## 2024-12-03 DIAGNOSIS — D229 Melanocytic nevi, unspecified: Secondary | ICD-10-CM

## 2024-12-03 DIAGNOSIS — L570 Actinic keratosis: Secondary | ICD-10-CM

## 2024-12-03 DIAGNOSIS — L578 Other skin changes due to chronic exposure to nonionizing radiation: Secondary | ICD-10-CM | POA: Diagnosis not present

## 2024-12-03 DIAGNOSIS — D1801 Hemangioma of skin and subcutaneous tissue: Secondary | ICD-10-CM | POA: Diagnosis not present

## 2024-12-03 DIAGNOSIS — Z872 Personal history of diseases of the skin and subcutaneous tissue: Secondary | ICD-10-CM

## 2024-12-03 DIAGNOSIS — L821 Other seborrheic keratosis: Secondary | ICD-10-CM

## 2024-12-03 DIAGNOSIS — L820 Inflamed seborrheic keratosis: Secondary | ICD-10-CM

## 2024-12-03 DIAGNOSIS — W908XXA Exposure to other nonionizing radiation, initial encounter: Secondary | ICD-10-CM | POA: Diagnosis not present

## 2024-12-03 DIAGNOSIS — L814 Other melanin hyperpigmentation: Secondary | ICD-10-CM | POA: Diagnosis not present

## 2024-12-03 DIAGNOSIS — Z7189 Other specified counseling: Secondary | ICD-10-CM

## 2024-12-03 DIAGNOSIS — D692 Other nonthrombocytopenic purpura: Secondary | ICD-10-CM | POA: Diagnosis not present

## 2024-12-03 NOTE — Progress Notes (Signed)
 "  Follow-Up Visit   Subjective  Brett Ray is a 89 y.o. male who presents for the following: Skin Cancer Screening and Upper Body Skin Exam AKs >100m f/u, scalp, face, recheck ISK R medial ankle LN2 06/04/24, pt has been getting skin rashes coming in various places, chest, axilla, itchy, using Aveeno cream, patient thinks he is getting bites as well on arms, chest, legs, itchy then resolves  The patient presents for Upper Body Skin Exam (UBSE) for skin cancer screening and mole check. The patient has spots, moles and lesions to be evaluated, some may be new or changing and the patient may have concern these could be cancer.  The following portions of the chart were reviewed this encounter and updated as appropriate: medications, allergies, medical history  Review of Systems:  No other skin or systemic complaints except as noted in HPI or Assessment and Plan.  Objective  Well appearing patient in no apparent distress; mood and affect are within normal limits.  All skin waist up examined. Relevant physical exam findings are noted in the Assessment and Plan.  L cheek x 4, R ear x 1 (5) Pink scaly macules Scalp x 2 (2) Stuck on waxy paps with erythema  Assessment & Plan  AK (ACTINIC KERATOSIS) (5) L cheek x 4, R ear x 1 (5) Actinic keratoses are precancerous spots that appear secondary to cumulative UV radiation exposure/sun exposure over time. They are chronic with expected duration over 1 year. A portion of actinic keratoses will progress to squamous cell carcinoma of the skin. It is not possible to reliably predict which spots will progress to skin cancer and so treatment is recommended to prevent development of skin cancer.  Recommend daily broad spectrum sunscreen SPF 30+ to sun-exposed areas, reapply every 2 hours as needed.  Recommend staying in the shade or wearing long sleeves, sun glasses (UVA+UVB protection) and wide brim hats (4-inch brim around the entire circumference of the  hat). Call for new or changing lesions. - Destruction of lesion - L cheek x 4, R ear x 1 (5) Complexity: simple   Destruction method: cryotherapy   Informed consent: discussed and consent obtained   Timeout:  patient name, date of birth, surgical site, and procedure verified Lesion destroyed using liquid nitrogen: Yes   Region frozen until ice ball extended beyond lesion: Yes   Outcome: patient tolerated procedure well with no complications   Post-procedure details: wound care instructions given    INFLAMED SEBORRHEIC KERATOSIS (2) Scalp x 2 (2) Symptomatic, irritating, patient would like treated. - Destruction of lesion - Scalp x 2 (2) Complexity: simple   Destruction method: cryotherapy   Informed consent: discussed and consent obtained   Timeout:  patient name, date of birth, surgical site, and procedure verified Lesion destroyed using liquid nitrogen: Yes   Region frozen until ice ball extended beyond lesion: Yes   Outcome: patient tolerated procedure well with no complications   Post-procedure details: wound care instructions given    ACTINIC SKIN DAMAGE   LENTIGO   MELANOCYTIC NEVUS, UNSPECIFIED LOCATION   SEBORRHEIC KERATOSIS   SKIN CANCER SCREENING   COUNSELING AND COORDINATION OF CARE   PURPURA   RASH     Skin cancer screening performed today.  Actinic Damage - Chronic condition, secondary to cumulative UV/sun exposure - diffuse scaly erythematous macules with underlying dyspigmentation - Recommend daily broad spectrum sunscreen SPF 30+ to sun-exposed areas, reapply every 2 hours as needed.  - Staying in the shade or  wearing long sleeves, sun glasses (UVA+UVB protection) and wide brim hats (4-inch brim around the entire circumference of the hat) are also recommended for sun protection.  - Call for new or changing lesions.  Lentigines, Seborrheic Keratoses, Hemangiomas - Benign normal skin lesions - Benign-appearing - Call for any  changes  Melanocytic Nevi - Tan-brown and/or pink-flesh-colored symmetric macules and papules - Benign appearing on exam today - Observation - Call clinic for new or changing moles - Recommend daily use of broad spectrum spf 30+ sunscreen to sun-exposed areas.   RASH HISTORY of Bumps that sound like possible BITE REACTION No evidence today Arms, legs, chest Exam: clear today Treatment Plan: Discussed talking with [facility] to have maintenance and possibly exterminator come check and treat if necessary  Purpura - Chronic; persistent and recurrent.  Treatable, but not curable. arms - Violaceous macules and patches - Benign - Related to trauma, age, sun damage and/or use of blood thinners, chronic use of topical and/or oral steroids - Observe - Can use OTC arnica containing moisturizer such as Dermend Bruise Formula if desired - Call for worsening or other concerns  HISTORY of INFLAMED SEBORRHEIC KERATOSIS R medial ankle Exam: clear today Treatment Plan: Resolved with LN2    Return in about 6 months (around 06/02/2025) for AK f/u.  I, Grayce Saunas, RMA, am acting as scribe for Alm Rhyme, MD .   Documentation: I have reviewed the above documentation for accuracy and completeness, and I agree with the above.  Alm Rhyme, MD     "

## 2024-12-03 NOTE — Patient Instructions (Addendum)

## 2025-06-10 ENCOUNTER — Ambulatory Visit: Admitting: Dermatology
